# Patient Record
Sex: Male | Born: 1935 | Race: Black or African American | Hispanic: No | Smoking: Former smoker
Health system: Southern US, Community
[De-identification: ages and names within clinical notes are randomized; demographics above are authoritative.]

## PROBLEM LIST (undated history)

## (undated) DIAGNOSIS — Z923 Personal history of irradiation: Secondary | ICD-10-CM

## (undated) DIAGNOSIS — R519 Headache, unspecified: Secondary | ICD-10-CM

## (undated) DIAGNOSIS — R51 Headache: Secondary | ICD-10-CM

## (undated) DIAGNOSIS — I5189 Other ill-defined heart diseases: Secondary | ICD-10-CM

## (undated) DIAGNOSIS — J449 Chronic obstructive pulmonary disease, unspecified: Secondary | ICD-10-CM

## (undated) DIAGNOSIS — I359 Nonrheumatic aortic valve disorder, unspecified: Secondary | ICD-10-CM

## (undated) DIAGNOSIS — C61 Malignant neoplasm of prostate: Secondary | ICD-10-CM

## (undated) DIAGNOSIS — R0602 Shortness of breath: Principal | ICD-10-CM

## (undated) DIAGNOSIS — C20 Malignant neoplasm of rectum: Secondary | ICD-10-CM

## (undated) DIAGNOSIS — I071 Rheumatic tricuspid insufficiency: Secondary | ICD-10-CM

## (undated) DIAGNOSIS — R0789 Other chest pain: Secondary | ICD-10-CM

## (undated) DIAGNOSIS — R9430 Abnormal result of cardiovascular function study, unspecified: Secondary | ICD-10-CM

## (undated) DIAGNOSIS — R001 Bradycardia, unspecified: Secondary | ICD-10-CM

## (undated) DIAGNOSIS — I272 Pulmonary hypertension, unspecified: Secondary | ICD-10-CM

## (undated) DIAGNOSIS — C801 Malignant (primary) neoplasm, unspecified: Secondary | ICD-10-CM

## (undated) DIAGNOSIS — Z950 Presence of cardiac pacemaker: Secondary | ICD-10-CM

## (undated) DIAGNOSIS — I10 Essential (primary) hypertension: Secondary | ICD-10-CM

## (undated) HISTORY — DX: Nonrheumatic aortic valve disorder, unspecified: I35.9

## (undated) HISTORY — DX: Abnormal result of cardiovascular function study, unspecified: R94.30

## (undated) HISTORY — DX: Rheumatic tricuspid insufficiency: I07.1

## (undated) HISTORY — DX: Essential (primary) hypertension: I10

## (undated) HISTORY — DX: Presence of cardiac pacemaker: Z95.0

## (undated) HISTORY — DX: Pulmonary hypertension, unspecified: I27.20

## (undated) HISTORY — DX: Malignant neoplasm of prostate: C61

## (undated) HISTORY — DX: Bradycardia, unspecified: R00.1

## (undated) HISTORY — DX: Other chest pain: R07.89

## (undated) HISTORY — DX: Other ill-defined heart diseases: I51.89

## (undated) HISTORY — DX: Chronic obstructive pulmonary disease, unspecified: J44.9

## (undated) HISTORY — DX: Shortness of breath: R06.02

---
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
## 2007-03-12 ENCOUNTER — Encounter: Admission: RE | Admit: 2007-03-12 | Discharge: 2007-03-12 | Payer: Self-pay | Admitting: Family Medicine

## 2007-03-27 ENCOUNTER — Ambulatory Visit: Payer: Self-pay | Admitting: Internal Medicine

## 2007-05-05 ENCOUNTER — Encounter (HOSPITAL_COMMUNITY): Admission: RE | Admit: 2007-05-05 | Discharge: 2007-06-25 | Payer: Self-pay | Admitting: Urology

## 2007-06-17 ENCOUNTER — Ambulatory Visit: Payer: Self-pay | Admitting: Internal Medicine

## 2007-07-23 DIAGNOSIS — C61 Malignant neoplasm of prostate: Secondary | ICD-10-CM

## 2007-07-23 HISTORY — PX: EXCISIONAL HEMORRHOIDECTOMY: SHX1541

## 2007-07-23 HISTORY — DX: Malignant neoplasm of prostate: C61

## 2007-09-20 HISTORY — PX: INSERT / REPLACE / REMOVE PACEMAKER: SUR710

## 2007-10-18 ENCOUNTER — Inpatient Hospital Stay (HOSPITAL_COMMUNITY): Admission: EM | Admit: 2007-10-18 | Discharge: 2007-10-20 | Payer: Self-pay | Admitting: Emergency Medicine

## 2007-10-18 ENCOUNTER — Ambulatory Visit: Payer: Self-pay | Admitting: *Deleted

## 2007-10-20 ENCOUNTER — Encounter: Payer: Self-pay | Admitting: Cardiology

## 2007-10-27 ENCOUNTER — Ambulatory Visit: Payer: Self-pay

## 2007-11-04 ENCOUNTER — Ambulatory Visit: Payer: Self-pay

## 2007-11-04 ENCOUNTER — Ambulatory Visit: Payer: Self-pay | Admitting: Cardiology

## 2007-11-04 LAB — CONVERTED CEMR LAB
Calcium: 9.8 mg/dL (ref 8.4–10.5)
GFR calc Af Amer: 84 mL/min
Sodium: 140 meq/L (ref 135–145)

## 2007-11-05 ENCOUNTER — Ambulatory Visit: Payer: Self-pay | Admitting: Cardiology

## 2007-11-05 LAB — CONVERTED CEMR LAB: Potassium: 4.8 meq/L (ref 3.5–5.1)

## 2007-11-18 ENCOUNTER — Ambulatory Visit: Payer: Self-pay | Admitting: Cardiology

## 2008-02-09 ENCOUNTER — Ambulatory Visit: Payer: Self-pay | Admitting: Internal Medicine

## 2008-04-21 DIAGNOSIS — C20 Malignant neoplasm of rectum: Secondary | ICD-10-CM

## 2008-04-21 HISTORY — PX: BIOPSY RECTAL: PRO29

## 2008-04-21 HISTORY — PX: PROSTATE BIOPSY: SHX241

## 2008-04-21 HISTORY — DX: Malignant neoplasm of rectum: C20

## 2008-04-25 ENCOUNTER — Encounter (INDEPENDENT_AMBULATORY_CARE_PROVIDER_SITE_OTHER): Payer: Self-pay | Admitting: Surgery

## 2008-04-25 ENCOUNTER — Ambulatory Visit (HOSPITAL_COMMUNITY): Admission: RE | Admit: 2008-04-25 | Discharge: 2008-04-25 | Payer: Self-pay | Admitting: Surgery

## 2008-05-04 ENCOUNTER — Ambulatory Visit: Payer: Self-pay | Admitting: Hematology and Oncology

## 2008-05-12 ENCOUNTER — Ambulatory Visit: Payer: Self-pay | Admitting: Cardiology

## 2008-05-13 LAB — COMPREHENSIVE METABOLIC PANEL
ALT: 8 U/L (ref 0–53)
CO2: 28 mEq/L (ref 19–32)
Calcium: 9.5 mg/dL (ref 8.4–10.5)
Chloride: 102 mEq/L (ref 96–112)
Glucose, Bld: 80 mg/dL (ref 70–99)
Sodium: 140 mEq/L (ref 135–145)
Total Protein: 7.8 g/dL (ref 6.0–8.3)

## 2008-05-13 LAB — CBC WITH DIFFERENTIAL/PLATELET
BASO%: 0.7 % (ref 0.0–2.0)
Eosinophils Absolute: 0.1 10*3/uL (ref 0.0–0.5)
HCT: 35.9 % — ABNORMAL LOW (ref 38.7–49.9)
MCHC: 33 g/dL (ref 32.0–35.9)
MONO#: 0.5 10*3/uL (ref 0.1–0.9)
NEUT#: 5 10*3/uL (ref 1.5–6.5)
NEUT%: 66.1 % (ref 40.0–75.0)
RBC: 4.43 10*6/uL (ref 4.20–5.71)
WBC: 7.5 10*3/uL (ref 4.0–10.0)
lymph#: 2 10*3/uL (ref 0.9–3.3)

## 2008-05-17 ENCOUNTER — Ambulatory Visit: Admission: RE | Admit: 2008-05-17 | Discharge: 2008-08-08 | Payer: Self-pay | Admitting: Radiation Oncology

## 2008-05-20 ENCOUNTER — Ambulatory Visit (HOSPITAL_COMMUNITY): Admission: RE | Admit: 2008-05-20 | Discharge: 2008-05-20 | Payer: Self-pay | Admitting: Radiation Oncology

## 2008-06-06 ENCOUNTER — Ambulatory Visit (HOSPITAL_COMMUNITY): Admission: RE | Admit: 2008-06-06 | Discharge: 2008-06-06 | Payer: Self-pay | Admitting: Hematology and Oncology

## 2008-06-07 LAB — CBC WITH DIFFERENTIAL/PLATELET
Basophils Absolute: 0 10*3/uL (ref 0.0–0.1)
EOS%: 0 % (ref 0.0–7.0)
Eosinophils Absolute: 0 10*3/uL (ref 0.0–0.5)
LYMPH%: 17.6 % (ref 14.0–48.0)
MCH: 26.8 pg — ABNORMAL LOW (ref 28.0–33.4)
MCV: 80.9 fL — ABNORMAL LOW (ref 81.6–98.0)
MONO%: 7.7 % (ref 0.0–13.0)
Platelets: 166 10*3/uL (ref 145–400)
RBC: 4.46 10*6/uL (ref 4.20–5.71)
RDW: 14.5 % (ref 11.2–14.6)

## 2008-06-07 LAB — COMPREHENSIVE METABOLIC PANEL
ALT: <8 U/L (ref 0–53)
BUN: 48 mg/dL — ABNORMAL HIGH (ref 6–23)
CO2: 26 mEq/L (ref 19–32)
Calcium: 10 mg/dL (ref 8.4–10.5)
Chloride: 103 mEq/L (ref 96–112)
Creatinine, Ser: 1.7 mg/dL — ABNORMAL HIGH (ref 0.40–1.50)
Glucose, Bld: 115 mg/dL — ABNORMAL HIGH (ref 70–99)
Total Bilirubin: 0.4 mg/dL (ref 0.3–1.2)

## 2008-06-15 ENCOUNTER — Emergency Department (HOSPITAL_COMMUNITY): Admission: EM | Admit: 2008-06-15 | Discharge: 2008-06-15 | Payer: Self-pay | Admitting: Emergency Medicine

## 2008-06-17 ENCOUNTER — Ambulatory Visit: Payer: Self-pay | Admitting: Hematology and Oncology

## 2008-06-21 LAB — CBC WITH DIFFERENTIAL/PLATELET
Basophils Absolute: 0 10*3/uL (ref 0.0–0.1)
EOS%: 3.1 % (ref 0.0–7.0)
HCT: 35.5 % — ABNORMAL LOW (ref 38.7–49.9)
HGB: 11.9 g/dL — ABNORMAL LOW (ref 13.0–17.1)
MCH: 27.2 pg — ABNORMAL LOW (ref 28.0–33.4)
MCV: 81.4 fL — ABNORMAL LOW (ref 81.6–98.0)
MONO%: 8.9 % (ref 0.0–13.0)
NEUT%: 67.7 % (ref 40.0–75.0)
RDW: 16.1 % — ABNORMAL HIGH (ref 11.2–14.6)

## 2008-06-21 LAB — COMPREHENSIVE METABOLIC PANEL
AST: 16 U/L (ref 0–37)
Alkaline Phosphatase: 53 U/L (ref 39–117)
BUN: 23 mg/dL (ref 6–23)
Creatinine, Ser: 1.12 mg/dL (ref 0.40–1.50)
Total Bilirubin: 0.3 mg/dL (ref 0.3–1.2)

## 2008-06-28 LAB — CBC WITH DIFFERENTIAL/PLATELET
Basophils Absolute: 0 10*3/uL (ref 0.0–0.1)
EOS%: 2.7 % (ref 0.0–7.0)
HCT: 37.5 % — ABNORMAL LOW (ref 38.7–49.9)
HGB: 12.7 g/dL — ABNORMAL LOW (ref 13.0–17.1)
MCH: 27.5 pg — ABNORMAL LOW (ref 28.0–33.4)
MCV: 81.5 fL — ABNORMAL LOW (ref 81.6–98.0)
MONO%: 17.4 % — ABNORMAL HIGH (ref 0.0–13.0)
NEUT%: 62.8 % (ref 40.0–75.0)
lymph#: 0.7 10*3/uL — ABNORMAL LOW (ref 0.9–3.3)

## 2008-07-12 LAB — COMPREHENSIVE METABOLIC PANEL
AST: 19 U/L (ref 0–37)
Albumin: 4.3 g/dL (ref 3.5–5.2)
Alkaline Phosphatase: 49 U/L (ref 39–117)
BUN: 24 mg/dL — ABNORMAL HIGH (ref 6–23)
Creatinine, Ser: 1.33 mg/dL (ref 0.40–1.50)
Potassium: 5 mEq/L (ref 3.5–5.3)
Total Bilirubin: 0.5 mg/dL (ref 0.3–1.2)

## 2008-07-12 LAB — CBC WITH DIFFERENTIAL/PLATELET
Basophils Absolute: 0 10*3/uL (ref 0.0–0.1)
EOS%: 2.7 % (ref 0.0–7.0)
HGB: 12.7 g/dL — ABNORMAL LOW (ref 13.0–17.1)
LYMPH%: 7.9 % — ABNORMAL LOW (ref 14.0–48.0)
MCH: 27.3 pg — ABNORMAL LOW (ref 28.0–33.4)
MCV: 83.2 fL (ref 81.6–98.0)
MONO%: 16.5 % — ABNORMAL HIGH (ref 0.0–13.0)
Platelets: 152 10*3/uL (ref 145–400)
RDW: 17.1 % — ABNORMAL HIGH (ref 11.2–14.6)

## 2008-07-19 LAB — CBC WITH DIFFERENTIAL/PLATELET
Basophils Absolute: 0.1 10*3/uL (ref 0.0–0.1)
EOS%: 2.2 % (ref 0.0–7.0)
Eosinophils Absolute: 0.1 10*3/uL (ref 0.0–0.5)
HCT: 36.6 % — ABNORMAL LOW (ref 38.7–49.9)
HGB: 11.8 g/dL — ABNORMAL LOW (ref 13.0–17.1)
LYMPH%: 6.5 % — ABNORMAL LOW (ref 14.0–48.0)
MCH: 27.1 pg — ABNORMAL LOW (ref 28.0–33.4)
MCV: 83.9 fL (ref 81.6–98.0)
MONO%: 18.3 % — ABNORMAL HIGH (ref 0.0–13.0)
NEUT#: 3.7 10*3/uL (ref 1.5–6.5)
NEUT%: 71.2 % (ref 40.0–75.0)
Platelets: 145 10*3/uL (ref 145–400)

## 2008-08-02 ENCOUNTER — Ambulatory Visit: Payer: Self-pay | Admitting: Hematology and Oncology

## 2008-08-02 LAB — CBC WITH DIFFERENTIAL/PLATELET
BASO%: 0.1 % (ref 0.0–2.0)
Basophils Absolute: 0 10*3/uL (ref 0.0–0.1)
Eosinophils Absolute: 0 10*3/uL (ref 0.0–0.5)
HCT: 32.3 % — ABNORMAL LOW (ref 38.7–49.9)
HGB: 11 g/dL — ABNORMAL LOW (ref 13.0–17.1)
MCHC: 34.2 g/dL (ref 32.0–35.9)
MONO#: 0.7 10*3/uL (ref 0.1–0.9)
NEUT#: 2.7 10*3/uL (ref 1.5–6.5)
NEUT%: 71.2 % (ref 40.0–75.0)
WBC: 3.8 10*3/uL — ABNORMAL LOW (ref 4.0–10.0)
lymph#: 0.3 10*3/uL — ABNORMAL LOW (ref 0.9–3.3)

## 2008-08-09 LAB — CBC WITH DIFFERENTIAL/PLATELET
Basophils Absolute: 0 10*3/uL (ref 0.0–0.1)
EOS%: 0.8 % (ref 0.0–7.0)
HCT: 35.7 % — ABNORMAL LOW (ref 38.7–49.9)
HGB: 11.8 g/dL — ABNORMAL LOW (ref 13.0–17.1)
MCH: 27.6 pg — ABNORMAL LOW (ref 28.0–33.4)
NEUT%: 66.3 % (ref 40.0–75.0)
Platelets: 132 10*3/uL — ABNORMAL LOW (ref 145–400)
lymph#: 0.5 10*3/uL — ABNORMAL LOW (ref 0.9–3.3)

## 2008-09-06 ENCOUNTER — Ambulatory Visit (HOSPITAL_COMMUNITY): Admission: RE | Admit: 2008-09-06 | Discharge: 2008-09-06 | Payer: Self-pay | Admitting: Hematology and Oncology

## 2008-09-15 LAB — COMPREHENSIVE METABOLIC PANEL
BUN: 21 mg/dL (ref 6–23)
CO2: 27 mEq/L (ref 19–32)
Calcium: 10 mg/dL (ref 8.4–10.5)
Chloride: 104 mEq/L (ref 96–112)
Creatinine, Ser: 0.97 mg/dL (ref 0.40–1.50)
Glucose, Bld: 90 mg/dL (ref 70–99)
Total Bilirubin: 0.4 mg/dL (ref 0.3–1.2)

## 2008-09-15 LAB — CBC WITH DIFFERENTIAL/PLATELET
Basophils Absolute: 0 10*3/uL (ref 0.0–0.1)
HCT: 40.9 % (ref 38.4–49.9)
HGB: 13.7 g/dL (ref 13.0–17.1)
LYMPH%: 25.6 % (ref 14.0–49.0)
MCH: 28.2 pg (ref 27.2–33.4)
MONO#: 0.3 10*3/uL (ref 0.1–0.9)
NEUT%: 63.7 % (ref 39.0–75.0)
Platelets: 161 10*3/uL (ref 140–400)
WBC: 3 10*3/uL — ABNORMAL LOW (ref 4.0–10.3)
lymph#: 0.8 10*3/uL — ABNORMAL LOW (ref 0.9–3.3)

## 2008-09-20 ENCOUNTER — Encounter: Admission: RE | Admit: 2008-09-20 | Discharge: 2008-09-20 | Payer: Self-pay | Admitting: Surgery

## 2008-11-04 ENCOUNTER — Encounter (INDEPENDENT_AMBULATORY_CARE_PROVIDER_SITE_OTHER): Payer: Self-pay | Admitting: *Deleted

## 2008-11-22 ENCOUNTER — Telehealth: Payer: Self-pay | Admitting: Internal Medicine

## 2008-11-23 ENCOUNTER — Encounter: Payer: Self-pay | Admitting: Internal Medicine

## 2008-11-23 ENCOUNTER — Ambulatory Visit: Payer: Self-pay

## 2008-11-26 DIAGNOSIS — I2789 Other specified pulmonary heart diseases: Secondary | ICD-10-CM

## 2008-11-26 DIAGNOSIS — I079 Rheumatic tricuspid valve disease, unspecified: Secondary | ICD-10-CM | POA: Insufficient documentation

## 2008-11-26 DIAGNOSIS — J449 Chronic obstructive pulmonary disease, unspecified: Secondary | ICD-10-CM | POA: Insufficient documentation

## 2008-11-26 DIAGNOSIS — J4489 Other specified chronic obstructive pulmonary disease: Secondary | ICD-10-CM | POA: Insufficient documentation

## 2008-11-26 DIAGNOSIS — I359 Nonrheumatic aortic valve disorder, unspecified: Secondary | ICD-10-CM | POA: Insufficient documentation

## 2008-11-28 ENCOUNTER — Ambulatory Visit: Payer: Self-pay | Admitting: Cardiology

## 2008-12-09 ENCOUNTER — Ambulatory Visit: Payer: Self-pay | Admitting: Hematology and Oncology

## 2008-12-13 ENCOUNTER — Encounter: Payer: Self-pay | Admitting: Cardiology

## 2008-12-13 LAB — CBC WITH DIFFERENTIAL/PLATELET
BASO%: 0.4 % (ref 0.0–2.0)
Basophils Absolute: 0 10*3/uL (ref 0.0–0.1)
EOS%: 1.6 % (ref 0.0–7.0)
HCT: 37.3 % — ABNORMAL LOW (ref 38.4–49.9)
HGB: 12.3 g/dL — ABNORMAL LOW (ref 13.0–17.1)
LYMPH%: 21.2 % (ref 14.0–49.0)
MCH: 28.4 pg (ref 27.2–33.4)
MCHC: 32.9 g/dL (ref 32.0–36.0)
MCV: 86.1 fL (ref 79.3–98.0)
NEUT%: 63.8 % (ref 39.0–75.0)
Platelets: 104 10*3/uL — ABNORMAL LOW (ref 140–400)
lymph#: 0.8 10*3/uL — ABNORMAL LOW (ref 0.9–3.3)

## 2008-12-13 LAB — COMPREHENSIVE METABOLIC PANEL
AST: 17 U/L (ref 0–37)
BUN: 17 mg/dL (ref 6–23)
Calcium: 9.8 mg/dL (ref 8.4–10.5)
Chloride: 105 mEq/L (ref 96–112)
Creatinine, Ser: 1.14 mg/dL (ref 0.40–1.50)
Total Bilirubin: 0.4 mg/dL (ref 0.3–1.2)

## 2009-02-20 ENCOUNTER — Ambulatory Visit (HOSPITAL_COMMUNITY): Admission: RE | Admit: 2009-02-20 | Discharge: 2009-02-20 | Payer: Self-pay | Admitting: Hematology and Oncology

## 2009-03-14 ENCOUNTER — Ambulatory Visit: Payer: Self-pay | Admitting: Hematology and Oncology

## 2009-03-17 LAB — CBC WITH DIFFERENTIAL/PLATELET
BASO%: 0.6 % (ref 0.0–2.0)
Basophils Absolute: 0 10*3/uL (ref 0.0–0.1)
EOS%: 1.5 % (ref 0.0–7.0)
HCT: 37.2 % — ABNORMAL LOW (ref 38.4–49.9)
HGB: 12.4 g/dL — ABNORMAL LOW (ref 13.0–17.1)
LYMPH%: 19.3 % (ref 14.0–49.0)
MCH: 27.5 pg (ref 27.2–33.4)
MCHC: 33.3 g/dL (ref 32.0–36.0)
MCV: 82.7 fL (ref 79.3–98.0)
MONO%: 7.3 % (ref 0.0–14.0)
NEUT%: 71.3 % (ref 39.0–75.0)
lymph#: 0.6 10*3/uL — ABNORMAL LOW (ref 0.9–3.3)

## 2009-03-17 LAB — COMPREHENSIVE METABOLIC PANEL
ALT: <8 U/L (ref 0–53)
AST: 14 U/L (ref 0–37)
Alkaline Phosphatase: 50 U/L (ref 39–117)
BUN: 14 mg/dL (ref 6–23)
Calcium: 9.4 mg/dL (ref 8.4–10.5)
Chloride: 104 mEq/L (ref 96–112)
Creatinine, Ser: 1.02 mg/dL (ref 0.40–1.50)
Total Bilirubin: 0.4 mg/dL (ref 0.3–1.2)

## 2009-03-22 ENCOUNTER — Encounter: Payer: Self-pay | Admitting: Cardiology

## 2009-05-29 ENCOUNTER — Encounter: Payer: Self-pay | Admitting: Cardiology

## 2009-05-30 ENCOUNTER — Ambulatory Visit: Payer: Self-pay | Admitting: Cardiology

## 2009-08-04 ENCOUNTER — Ambulatory Visit: Payer: Self-pay | Admitting: Hematology and Oncology

## 2009-08-14 ENCOUNTER — Ambulatory Visit (HOSPITAL_COMMUNITY): Admission: RE | Admit: 2009-08-14 | Discharge: 2009-08-14 | Payer: Self-pay | Admitting: Oncology

## 2009-08-14 LAB — CBC WITH DIFFERENTIAL/PLATELET
BASO%: 0.5 % (ref 0.0–2.0)
LYMPH%: 29.6 % (ref 14.0–49.0)
MCHC: 31.9 g/dL — ABNORMAL LOW (ref 32.0–36.0)
MONO#: 0.3 10*3/uL (ref 0.1–0.9)
Platelets: 140 10*3/uL (ref 140–400)
RBC: 4.96 10*6/uL (ref 4.20–5.82)
WBC: 3.8 10*3/uL — ABNORMAL LOW (ref 4.0–10.3)
lymph#: 1.1 10*3/uL (ref 0.9–3.3)
nRBC: 0 % (ref 0–0)

## 2009-08-14 LAB — COMPREHENSIVE METABOLIC PANEL
ALT: 10 U/L (ref 0–53)
AST: 22 U/L (ref 0–37)
Alkaline Phosphatase: 57 U/L (ref 39–117)
Calcium: 9.2 mg/dL (ref 8.4–10.5)
Chloride: 101 mEq/L (ref 96–112)
Creatinine, Ser: 1 mg/dL (ref 0.40–1.50)
Total Bilirubin: 0.4 mg/dL (ref 0.3–1.2)

## 2009-08-14 LAB — CEA: CEA: 1.4 ng/mL (ref 0.0–5.0)

## 2009-08-17 ENCOUNTER — Encounter: Payer: Self-pay | Admitting: Cardiology

## 2010-02-08 ENCOUNTER — Ambulatory Visit: Payer: Self-pay | Admitting: Hematology and Oncology

## 2010-02-12 ENCOUNTER — Ambulatory Visit (HOSPITAL_COMMUNITY): Admission: RE | Admit: 2010-02-12 | Discharge: 2010-02-12 | Payer: Self-pay | Admitting: Hematology and Oncology

## 2010-02-12 LAB — COMPREHENSIVE METABOLIC PANEL
AST: 18 U/L (ref 0–37)
Albumin: 3.8 g/dL (ref 3.5–5.2)
BUN: 25 mg/dL — ABNORMAL HIGH (ref 6–23)
Calcium: 9.6 mg/dL (ref 8.4–10.5)
Chloride: 104 mEq/L (ref 96–112)
Creatinine, Ser: 1.26 mg/dL (ref 0.40–1.50)
Glucose, Bld: 100 mg/dL — ABNORMAL HIGH (ref 70–99)
Potassium: 4.8 mEq/L (ref 3.5–5.3)

## 2010-02-12 LAB — CBC WITH DIFFERENTIAL/PLATELET
Basophils Absolute: 0 10*3/uL (ref 0.0–0.1)
EOS%: 1.4 % (ref 0.0–7.0)
Eosinophils Absolute: 0.1 10*3/uL (ref 0.0–0.5)
HCT: 37.3 % — ABNORMAL LOW (ref 38.4–49.9)
HGB: 12.6 g/dL — ABNORMAL LOW (ref 13.0–17.1)
MCH: 28 pg (ref 27.2–33.4)
MCV: 82.8 fL (ref 79.3–98.0)
NEUT#: 2.3 10*3/uL (ref 1.5–6.5)
NEUT%: 58.3 % (ref 39.0–75.0)
RDW: 15.4 % — ABNORMAL HIGH (ref 11.0–14.6)
lymph#: 1.1 10*3/uL (ref 0.9–3.3)

## 2010-02-12 LAB — LACTATE DEHYDROGENASE: LDH: 183 U/L (ref 94–250)

## 2010-02-19 ENCOUNTER — Encounter (INDEPENDENT_AMBULATORY_CARE_PROVIDER_SITE_OTHER): Payer: Self-pay | Admitting: *Deleted

## 2010-05-25 ENCOUNTER — Encounter: Payer: Self-pay | Admitting: Cardiology

## 2010-05-28 ENCOUNTER — Encounter: Payer: Self-pay | Admitting: Internal Medicine

## 2010-05-28 ENCOUNTER — Ambulatory Visit: Payer: Self-pay | Admitting: Cardiology

## 2010-08-12 ENCOUNTER — Encounter: Payer: Self-pay | Admitting: Hematology and Oncology

## 2010-08-12 ENCOUNTER — Encounter (HOSPITAL_COMMUNITY): Payer: Self-pay | Admitting: Oncology

## 2010-08-13 ENCOUNTER — Encounter: Payer: Self-pay | Admitting: Hematology and Oncology

## 2010-08-13 ENCOUNTER — Encounter: Payer: Self-pay | Admitting: Radiation Oncology

## 2010-08-23 NOTE — Cardiovascular Report (Signed)
Summary: Office Visit   Office Visit   Imported By: Roderic Ovens 06/05/2010 17:02:53  _____________________________________________________________________  External Attachment:    Type:   Image     Comment:   External Document

## 2010-08-23 NOTE — Letter (Signed)
Summary: Device-Delinquent Check  [facility], Main Office  [address]   [place], [place] [address]   Phone: [phone]  Fax: [phone]     February 19, 2010 MRN: [id number]   [facility] [address] [place], [place]  [address]   Dear Mr. Dass,  According to our records, you have not had your implanted device checked with Dr. Ladona Ridgel in the recommended period of time.  We are unable to determine appropriate device function without checking your device on a regular basis.  Please call our office to schedule an appointment as soon as possible.  If you are having your device checked by another physician, please call us so that we may update our records.  Thank you,   Arch[facility]

## 2010-08-23 NOTE — Procedures (Signed)
Summary: Cardiology Device Clinic   Current Medications (verified): 1)  Caltrate 600+d 600-400 Mg-Unit Tabs (Calcium Carbonate-Vitamin D) .... Take One Tablet By Mouth Twice Daily. 2)  Lisinopril 20 Mg Tabs (Lisinopril) .... Take One Tablet By Mouth Daily  Allergies (verified): No Known Drug Allergies  PPM Specifications PPM Vendor:  Medtronic     PPM Model Number:  [id number]     PPM DOI:  10/19/2007      Lead 1    Location: RA     DOI: 10/19/2007     Model #: [id number]     Serial #: [id number]     Status: active Lead 2    Location: RV     DOI: 10/19/2007     Model #: [id number]     Serial #: [id number]     Status: active   Indications:  CHB   PPM Follow Up Battery Voltage:  2.79 V     Battery Est. Longevity:  9 yrs     Pacer Dependent:  Yes       PPM Device Measurements Atrium  Amplitude: 1.00 mV, Impedance: 437 ohms, Threshold: 0.750 V at 0.40 msec Right Ventricle  Amplitude: PACED mV, Impedance: 503 ohms, Threshold: 0.750 V at 0.40 msec  Episodes MS Episodes:  242     Percent Mode Switch:  <0.1%     Coumadin:  No Ventricular High Rate:  1     Atrial Pacing:  9.2%     Ventricular Pacing:  99.3%  Parameters Mode:  DDDR     Lower Rate Limit:  60     Upper Rate Limit:  130 Paced AV Delay:  150     Sensed AV Delay:  120 Next Cardiology Appt Due:  11/20/2010 Tech Comments:  242 MODE SWITCHES--LONGEST WAS 4 MIN 39 SECONDS.  NORMAL DEVICE FUNCTION.  CHANGED RA OUTPUT FROM 1.75 TO 2.00 AND RV OUTPUT FROM 2.00 TO 2.50 V.  ROV IN 6 MTHS W/GT. [name]  May 28, 2010 3:44 PM

## 2010-08-23 NOTE — Assessment & Plan Note (Signed)
Summary: f1y   Visit Type:  Follow-up Primary Jenee Spaugh:  None  CC:  bradycardia.  History of Present Illness: The patient is seen for followup of bradycardia and pulmonary hypertension.  His pacemaker was placed in the past.  He has significant pulmonary hypertension.  He has good left ventricular function.  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  This is being treated.  He is not having chest pain syncope or presyncope.  Current Medications (verified): 1)  Caltrate 600+d 600-400 Mg-Unit Tabs (Calcium Carbonate-Vitamin D) .... Take One Tablet By Mouth Twice Daily. 2)  Lisinopril 20 Mg Tabs (Lisinopril) .... 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 March, 2009 COPD Prostate cancer Aortic valve calcification with mild AI...  Echo,... 2009 Status post appendectomy Nuclear study... EF 57% no definite ischemia.Marland KitchenMarland Kitchen April, 2009.. LV function..normal... echo... march, 2009 Squamous cell (invasive)  of the anal canal   Dr. Dalene Carrow  Review of Systems       The patient denies fever, chills, sweats, rash, change in vision, change in hearing, chest pain, cough, nausea vomiting, urinary symptoms.  All other systems are reviewed and are negative.  Vital Signs:  Patient profile:   77 year old male Height:      72 inches Weight:      137 pounds BMI:     18.65 Pulse rate:   86 / minute BP sitting:   136 / 68  (left arm) Cuff size:   regular  Vitals Entered By: Hardin Negus, RMA (May 28, 2010 12:02 PM)  Physical Exam  General:  patient is thin but stable. Eyes:  no xanthelasma. Mouth:   patient has poor dentition. Neck:  no jugular venous distention. Lungs:  lungs reveal distant lung sounds.  There is no respiratory distress. Heart:  cardiac exam reveals an S1-S2.  No clicks or significant murmurs. Abdomen:  abdomen is soft. Extremities:  no peripheral edema. Psych:  patient is oriented to person time and place.  Affect is normal.   PPM Specifications PPM Vendor:  Medtronic     PPM Model Number:  [id number]     PPM DOI:  10/19/2007      Lead 1    Location: RA     DOI: 10/19/2007     Model #: [id number]     Serial #: [id number]     Status: active Lead 2    Location: RV     DOI: 10/19/2007     Model #: 8469     Serial #: [id number]     Status: active   Indications:  CHB   PPM Follow Up Pacer Dependent:  Yes      Episodes Coumadin:  No  Parameters Mode:  DDDR     Lower Rate Limit:  60     Upper Rate Limit:  130 Paced AV Delay:  150     Sensed AV Delay:  120  Impression & Recommendations:  Problem # 1:  AORTIC INSUFFICIENCY (ICD-424.1)  His updated medication list for this problem includes:    Lisinopril 20 Mg Tabs (Lisinopril) .Marland Kitchen... Take one tablet by mouth daily The patient has mild aortic insufficiency by history.  He does not  need a followup echo at this time.  Problem # 2:  ESSENTIAL HYPERTENSION, BENIGN (ICD-401.1)  His updated medication list for this problem includes:    Lisinopril 20 Mg Tabs (Lisinopril) .Marland Kitchen... Take one tablet by mouth daily Blood pressure is controlled today.  No change in therapy.  Problem # 3:  COPD (ICD-496) COPD is stable at this time.  No change in therapy.  Problem # 4:  PACEMAKER, PERMANENT (ICD-V45.01)  Orders: EKG w/ Interpretation (93000) The patient had an EKG today and he is paced.  Full interrogation of his pacemaker is being done. Also, patient does not have a primary physician. But he does have an oncologist.  I am sure that they are following his labs carefully.  Patient's pacemaker is interrogated today.  All  parameters are stable.  No further workup.  Patient Instructions: 1)  Your physician wants you to follow-up in:  1 year with Dr Myrtis Ser.  You will receive a reminder letter in the mail two months in advance. If you don't receive a letter, please call our office to schedule the follow-up appointment. 2)  Your physician wants you to follow-up in:  6 months with Dr Ladona Ridgel.  You will receive a reminder letter in the mail two months in advance. If you don't receive a letter, please call our office to schedule the follow-up appointment.

## 2010-08-23 NOTE — Letter (Signed)
[facility]   Imported By: Roderic Ovens 09/15/2009 10:38:58  _____________________________________________________________________  External Attachment:    Type:   Image     Comment:   External Document

## 2010-08-23 NOTE — Miscellaneous (Signed)
  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 March, 2009 COPD Prostate cancer Aortic valve calcification with mild AI...  Echo,... 2009 Status post appendectomy Nuclear study... EF 57% no definite ischemia.Marland KitchenMarland Kitchen April, 2009 LV function..normal... echo... march, 2009 Squamous cell (invasive)  of the anal canal   Dr. 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 March, 2009 COPD Prostate cancer Aortic valve calcification with mild AI...  Echo,... 2009 Status post appendectomy Nuclear study... EF 57% no definite ischemia.Marland KitchenMarland Kitchen April, 2009 LV function..normal... echo... march, 2009 Squamous cell (invasive)  of the anal canal   Dr. Dalene Carrow

## 2010-11-06 LAB — GLUCOSE, CAPILLARY: Glucose-Capillary: 81 mg/dL (ref 70–99)

## 2010-11-21 ENCOUNTER — Encounter: Payer: Self-pay | Admitting: Internal Medicine

## 2010-11-22 ENCOUNTER — Ambulatory Visit (INDEPENDENT_AMBULATORY_CARE_PROVIDER_SITE_OTHER): Payer: Medicare Other | Admitting: Internal Medicine

## 2010-11-22 ENCOUNTER — Encounter: Payer: Self-pay | Admitting: Internal Medicine

## 2010-11-22 VITALS — BP 132/80 | HR 84 | Ht 73.0 in | Wt 135.0 lb

## 2010-11-22 DIAGNOSIS — I442 Atrioventricular block, complete: Secondary | ICD-10-CM

## 2010-11-22 DIAGNOSIS — Z950 Presence of cardiac pacemaker: Secondary | ICD-10-CM

## 2010-11-22 DIAGNOSIS — I10 Essential (primary) hypertension: Secondary | ICD-10-CM

## 2010-11-22 MED ORDER — LISINOPRIL 20 MG PO TABS: 20.0000 mg | ORAL_TABLET | Freq: Every day | ORAL | Status: DC

## 2010-11-22 NOTE — Assessment & Plan Note (Signed)
Despite his noncompliance, his blood pressure remains fairly well controlled. I've asked him to restart his medical therapy, and maintain a low-sodium diet.

## 2010-11-22 NOTE — Patient Instructions (Signed)
Your physician wants you to follow-up in: 6 months in device clinic and 12 months with Dr Taylor You will receive a reminder letter in the mail two months in advance. If you don't receive a letter, please call our office to schedule the follow-up appointment.  

## 2010-11-22 NOTE — Progress Notes (Signed)
HPI [name] returns today for followup. He is a pleasant elderly man with a history of symptomatic bradycardia and hypertension. He is status post permanent pacemaker insertion. He admits to dietary as well as medical noncompliance. They said he has been out of his lisinopril for over a month.he denies syncope, chest pain, or shortness of breath. No peripheral edema. Not on File   Current Outpatient Prescriptions  Medication Sig Dispense Refill  . Calcium Carbonate-Vitamin D (CALTRATE 600+D) 600-400 MG-UNIT per tablet Take 1 tablet by mouth daily.        Marland Kitchen lisinopril (PRINIVIL,ZESTRIL) 20 MG tablet Take 1 tablet (20 mg total) by mouth daily.  30 tablet  11  . DISCONTD: calcium-vitamin D (OSCAL WITH D) 250-125 MG-UNIT per tablet Take 1 tablet by mouth daily.        Marland Kitchen DISCONTD: lisinopril (PRINIVIL,ZESTRIL) 20 MG tablet Take 20 mg by mouth daily.           Past Medical History  Diagnosis Date  . Bradycardia   . Hypertension   . Tricuspid regurgitation     mild/moderate  . COPD (chronic obstructive pulmonary disease)   . Prostate cancer   . Aortic valve calcification   . Pulmonary HTN     ROS:   All systems reviewed and negative except as noted in the HPI.   Past Surgical History  Procedure Date  . Appendectomy      No family history on file.   History   Social History  . Marital Status: Legally Separated    Spouse Name: N/A    Number of Children: N/A  . Years of Education: N/A   Occupational History  . retired    Social History Main Topics  . Smoking status: Passive Smoker  . Smokeless tobacco: Not on file  . Alcohol Use: No  . Drug Use: No  . Sexually Active:    Other Topics Concern  . Not on file   Social History Narrative  . No narrative on file     BP 132/80  Pulse 84  Ht 6\' 1"  (1.854 m)  Wt 135 lb (61.236 kg)  BMI 17.81 kg/m2  Physical Exam: Thin, Well appearing NAD HEENT: Unremarkable Neck:  No JVD, no thyromegally Lymphatics:  No  adenopathy Back:  No CVA tenderness Lungs:  Clear. Well-healed pacemaker incision HEART:  Regular rate rhythm, no murmurs, no rubs, no clicks Abd:  Flat, positive bowel sounds, no organomegally, no rebound, no guarding Ext:  2 plus pulses, no edema, no cyanosis, no clubbing Skin:  No rashes no nodules Neuro:  CN II through XII intact, motor grossly intact  DEVICE  Normal device function.  See PaceArt for details.   Assess/Plan:

## 2010-11-22 NOTE — Assessment & Plan Note (Signed)
His device is working normally. We'll recheck in several months. 

## 2010-12-04 NOTE — Assessment & Plan Note (Signed)
[facility]                         ELECTROPHYSIOLOGY OFFICE NOTE   [name], [name]                       MRN:          [id number]  DATE:02/09/2008                            DOB:          Jan 02, 1930    [name] returns today for followup.  He is a very pleasant elderly  male with a history of complete heart block status post pacemaker  insertion back in March 2009.  He returns today for followup.  He is  scheduled undergo surgery for a rectal mass and will require biopsy for  this.  He has no specific complaints today except he has some very mild  leg discomfort.  His current medications include calcium supplement.   PHYSICAL EXAMINATION:  GENERAL:  He is a pleasant elderly man, in no  distress.  VITAL SIGNS:  Blood pressure 147/88, the pulse was 110 and irregular,  respirations were 18, and the weight was 135 pounds.  NECK:  No jugular venous distention.  LUNGS:  Clear bilaterally to auscultation.  No wheezes, rales, or  rhonchi are present.  CARDIOVASCULAR:  Irregular rhythm with normal S1 and S2.  EXTREMITIES:  Demonstrated no edema.   Interrogation of his pacemaker demonstrates a Medtronic Goodell, P-waves  were 4, the R-waves were unavailable secondary to complete heart block,  pacing threshold was 0.75 at 0.4 in the A and 0.5 at 0.4 in the RV, and  the pacing impedance was 443 in the A and 532 in the RV.  Battery  voltage was 2.79 volts.  His estimated longevity on his device was  approximately 7 years.  His histograms demonstrated that his underlying  sinus rate was somewhat increased.   IMPRESSION:  1. Symptomatic bradycardia secondary to complete heart block.  2. Status post pacemaker insertion.  3. History of hypertension.  4. Anal mass with the patient scheduled for biopsy undergo under      anesthesia.   DISCUSSION:  With regard to [name] pending surgery, I think he  will be low risk and would be allowed to proceed  with his biopsy  utilizing general anesthesia.  Obviously if he get problems during  surgery, we would be available on an as-needed basis.     Doylene Canning. Ladona Ridgel, MD  Electronically Signed    GWT/MedQ  DD: 02/09/2008  DT: 02/10/2008  Job #: [id number]   cc:   [name], M.D.

## 2010-12-04 NOTE — Discharge Summary (Signed)
[name], [name]              ACCOUNT NO.:  [id number]   MEDICAL RECORD NO.:  [id number]          PATIENT TYPE:  INP   LOCATION:  3707                         FACILITY:  [facility]   PHYSICIAN:  Jacob Canning. Ladona Ridgel, MD    DATE OF BIRTH:  Jan 27, 1930   DATE OF ADMISSION:  10/18/2007  DATE OF DISCHARGE:  10/20/2007                               DISCHARGE SUMMARY   ALLERGIES:  This patient has no known drug allergies.   DICTATION TIME AND EXAM:  Greater than 35 minutes.   FINAL DIAGNOSES:  1. Discharging day 1, status post implant of Medtronic Sensia dual-      chamber pacemaker.  2. Complete heart block on presentation, this admission with major      symptom dyspnea.  3. Hypertension with application of new medications to control this      admission.   SECONDARY DIAGNOSES:  1. Chronic obstructive pulmonary disease.  2. History of prostate cancer.  3. Troponin I study is negative this admission x3.   PROCEDURE:  October 19, 2007, implant of a Medtronic Sensia dual-chamber  pacemaker for complete heart block by Dr. Lewayne Bunting.  The patient had  no postprocedural complications.  No hematoma including no pain, that  could not be controlled with oral analgesics.  Chest x-ray shows that  the leads are in appropriate position.  The device has been interrogated  with normal function, and no changes made.  Mobility of the left arm has  been described to the patient as well as incision care.   BRIEF HISTORY:  [name].  He has a history of  COPD and prostate cancer.  He was awakened from sleep on the evening of  October 18, 2007, he was short of breath.  He had no chest pain,  presyncope, syncope, nausea, vomiting, or diaphoresis.  He has not been  experiencing edema, orthopnea, or paroxysmal nocturnal dyspnea.  He was  actually feeling well prior to going to sleep this evening on October 18, 2007.   He went outside to get some fresh air but did not help.  He came to  the  emergency room, he was found to be in third-degree heart block with  idioventricular escape rhythm.   HOSPITAL COURSE:  The patient was admitted to the emergency room to  [facility] on October 18, 2007.  He has complete heart block with  bradycardia and dyspnea.  His dyspnea is fairly well controlled.  He did  require temporary pacemaker over the weekend and early pacemaker  implantation.  Cardiac enzymes were cycled, and they were negative x3.  Electrocardiogram shows third-degree AVB with right bundle branch block.  The patient was found to be hypertensive generally, and his medications  prior to this admission consisted only of a multivitamin daily.  He was  added lisinopril 20 mg daily and then with persistent elevated blood  pressures, was added hydrochlorothiazide postprocedure day #1 after  implant of pacemaker.  He did have pacemaker implant October 19, 2007,  with no complications as mentioned above.  Discharging postprocedure day  #1,  he is asked to keep his incision dry for the next 7 days, to sponge  bathe until Monday, October 26, 2007.   MEDICATIONS AT DISCHARGE:  1. Multivitamin daily.  2. A new medication, lisinopril 20 mg daily.  Prescription given.  3. A new medication, hydrochlorothiazide 25 mg daily.  Prescription      given.  4. A new medication, potassium chloride 10 mEq daily.  Prescription      given.   FOLLOWUP:  He is to follow up at [facility] [place] for several  appointments:  1. Stress test, Tuesday, October 27, 2007, at 9:15.  He is asked to eat      nothing or drink nothing after midnight Monday, October 26, 2007.  He      may take his medication with sips of water in the morning on October 27, 2007.  2. He sees Dr. Eden Emms, Thursday, October 29, 2007, at 9 o'clock.  3. Pacer Clinic, Wednesday, November 04, 2007 at 9 o'clock.  A basic      metabolic panel will be taken at that time to check creatinine and      potassium on hydrochlorothiazide.  4. He sees Dr.  [name], Tuesday, February 09, 2008, at 11:10 in the      morning.   LABORATORY DATA:  Lab studies pertinent to this admission were drawn on  October 18, 2007.  Sodium 145, potassium is 4, chloride 111, carbonate 25,  glucose 102, BUN is 18, creatinine 1.05, alkaline phosphatase is 120,  SGOT 71, SGPT is 37.  His complete blood count; white cells are 9,  hemoglobin 11.6, hematocrit 35.5, platelets are 123.  Of note, his TSH  was 0.399 on lower side.  This could possibly be followed up as an  outpatient.      Maple Mirza, PA      Jacob Canning. Ladona Ridgel, MD  Electronically Signed    GM/MEDQ  D:  10/20/2007  T:  10/20/2007  Job:  [id number]   cc:   Noralyn Pick. Eden Emms, MD, [facility]

## 2010-12-04 NOTE — Assessment & Plan Note (Signed)
[facility]                             PULMONARY OFFICE NOTE   [name], [name]                       MRN:          [id number]  DATE:06/17/2007                            DOB:          1930-03-14    PULMONARY FOLLOW UP OFFICE VISIT:   HISTORY:  A 77 year old black male in for evaluation of COPD, stating he  has no breathing problem whatsoever with desired activities of daily  living.  He denies any cough, fevers, chills, sweats, orthopnea, PND or  leg swelling.   The purpose of his visit today is to obtain PFT's for baseline purposes.  Status post smoking cessation earlier this month.   MEDICATIONS:  The patient is taking no pulmonary medicines at present.   PHYSICAL EXAMINATION:  GENERAL:  He is a pleasant, ambulatory black male  in no acute distress.  VITAL SIGNS:  Stable.  HEENT:  Unremarkable.  Oropharynx clear.  LUNGS:  Lung fields are clear bilaterally to auscultation and  percussion, although breath sounds are diminished.  HEART:  Regular rhythm without murmur, gallop, or rub.  ABDOMEN:  Soft, benign.  EXTREMITIES:  Warm without calf tenderness.  No clubbing, cyanosis, or  edema.   PFT's performed indicate an FEV1 of 56% predicted with a ratio of 43%.   IMPRESSION:  Severe chronic obstructive pulmonary disease with a  diffusion capacity of 67%, suggesting that this is mostly emphysematous  in nature.  It may well be that if he is not smoking anymore, the  asthmatic component is mild enough that he will not need any form of  chronic suppressive therapy.  If he has frequent exacerbations, I would  start him on Advair.  If he notices a decline in best day lung function  and is unable to do what he wants, I would start him on Spiriva.  However, right now he is not inclined to take medicines.  I told him  that is fine as long as he remembers that smoking cessation is much more  important than choice of medications, or for that matter  doctors.  Therefore, pulmonary clinic will be p.r.n.     Charlaine Dalton. Sherene Sires, MD, [facility]  Electronically Signed    MBW/MedQ  DD: 06/18/2007  DT: 06/18/2007  Job #: [id number]

## 2010-12-04 NOTE — Assessment & Plan Note (Signed)
[facility]                             PULMONARY OFFICE NOTE   STARK, AGUINAGA                       MRN:          [id number]  DATE:03/27/2007                            DOB:          August 25, 1929    A delightful 77 year old black male, active smoker, complaining of  variable dyspnea for the last nine months, typically bothering him when  he goes out into the heat.  He could not give me one example, however,  of a reproducible pattern of dyspnea that occurs as long as he is in air  conditioning.  I asked him this question numerous ways to try to get at  the idea of whether he was having reproducible, predictable dyspnea with  a particular activity.  Even after asking the question multiple ways, he  was not able to respond to it.  That is, there does not appear to be one  single activity that he can list that he does daily that gives him any  difficulty at all.  Note that he does continue to smoke, although he has  cut it down to only nine cigarettes per day.  He denies any variability  with the seasons.  He denies any orthopnea, PND, leg swelling, chest  pain, or significant sputum production.   Past medical history is significant only for the fact that he has had  appendectomy.  He says he never goes to the doctor.   MEDICATIONS:  None.   SOCIAL HISTORY:  He continues to smoke, as noted above.   FAMILY HISTORY:  Positive for cancer in his sister, unknown cell type.   REVIEW OF SYSTEMS:  Taken in detail on the work sheet and negative  except as outlined above.   PHYSICAL EXAMINATION:  This is a stoic, ambulatory black male who, as  above, had a difficult time verbalizing exactly what was bothering him.  He is afebrile with normal vital signs.  HEENT:  Unremarkable except for poor dentition.  NECK:  Supple without cervical adenopathy or tenderness.  Trachea is  midline.  No thyromegaly.  LUNGS:  Mild hyperresonant percussion.  HEART:   Regular rhythm without murmur, rub or gallop.  ABDOMEN:  Soft, benign.  EXTREMITIES:  Warm without calf tenderness, clubbing, cyanosis or edema.   Chest x-ray revealed COPD with pleural thickening at the left apex.  The  study is done on the [facility] system, dated March 12, 2007, which was  reviewed.   IMPRESSION:  Dyspnea that is variable but initially suggesting an  asthmatic component but for the fact that he has no nocturnal symptoms  or for that matter, any predictable activity other than sometimes when  I get out in the heat, making him short of breath.  I suspect what this  patient has done is gradually reduced his activity level to the point  where he avoids being short of breath because he actually knows doing  anything more than slow ADLs in an air conditioning is going to make him  short of breath.  This is bad news because it also is a strategy many  patients use who have COPD with a progressive decline and best AFEV1 and  yet continue to smoke.  Without smoking cessation, there is very little  I can offer this patient to change the natural history of his disease.   I would, however, strongly recommend the patient return here for a set  of PFTs with pre and post bronchodilator studies and then might consider  initiating Spiriva, depending on the severity of his disease and whether  or not he is albuterol-responsive.   In the meantime, I pleaded with him to commit to quit smoking.     Charlaine Dalton. Sherene Sires, MD, [facility]  Electronically Signed    MBW/MedQ  DD: 03/27/2007  DT: 03/27/2007  Job #: [id number]   cc:   Burtis Junes, M.D.

## 2010-12-04 NOTE — Op Note (Signed)
NAMEONEY, FOLZ              ACCOUNT NO.:  [id number]   MEDICAL RECORD NO.:  [id number]          PATIENT TYPE:  AMB   LOCATION:  SDS                          FACILITY:  [facility]   PHYSICIAN:  [name], M.D.DATE OF BIRTH:  [birth year]   DATE OF PROCEDURE:  04/25/2008  DATE OF DISCHARGE:  04/25/2008                               OPERATIVE REPORT   PREOPERATIVE DIAGNOSIS:  Fungating mass, posterior anal canal.   POSTOPERATIVE DIAGNOSIS:  Squamous cell carcinoma, posterior anal canal.   PROCEDURE:  1. Exam under anesthesia.  2. Biopsy of posterior anal canal mass.   SURGEON:  Maisie Fus A. Cornett, MD   ANESTHESIA:  LMA with 0.25% Sensorcaine.   ESTIMATED BLOOD LOSS:  20 mL.   SPECIMENS:  Multiple tissue fragments from large posterior anal mass to  Pathology.  Frozen section showed squamous cell carcinoma.   INDICATIONS FOR PROCEDURE:  The patient is a 77 year old male, I saw  number of months ago due to a large fungating bleeding posterior anal  canal mass.  I recommended exam under anesthesia with biopsy.  Unfortunately, it has taken a number of months to get him cleared for  surgery, he presents now for biopsy of this.   DESCRIPTION OF PROCEDURE:  The patient was brought to the operating room  and placed supine.  After induction of LMA anesthesia, he was placed in  stirrups.  The perineum was prepped and draped in sterile fashion.  This  mass was obviously visible in the posterior midline of his anal canal  and he is fungating.  It has ulcerated and bleeding.  No signs of  infection though.  An anoscope was placed.  The mass extends involving  with 0.5 circumference of his anal canal.  It invaded through the  posterior sphincter mechanism.  It stents up about 2 cm above the  dentate line and extends 2 cm below the dentate line.  This was biopsied  and was shown to be a squamous  cell carcinoma, which was invasive.  Hemostasis was achieved.  Surgicel  and Gelfoam were  placed in the wound.  I injected 10 mL of 0.25%  Sensorcaine around this.  He was taken off stirrups, extubated and taken  to recovery room in satisfactory condition.  All final counts of sponge,  needle and found be correct.       [name], M.D.  Electronically Signed     TAC/MEDQ  D:  04/25/2008  T:  04/25/2008  Job:  [id number]   cc:   Lindaann Slough, M.D.

## 2010-12-04 NOTE — Assessment & Plan Note (Signed)
[facility]                            CARDIOLOGY OFFICE NOTE   [name], [name]                       MRN:          [id number]  DATE:05/12/2008                            DOB:          [birth year]    [name] is seen for cardiology followup.  He has a permanent  pacemaker.  He has no proven coronary artery disease.  He had a Myoview  in April 2009 showing diaphragmatic attenuation.  We cannot absolutely  rule out an infarct in the past.  There was no ischemia.  The ejection  fraction was in the 57% range.  He has had a mass diagnosed in his  rectum and he is going to the cancer center soon.  His cardiac status is  stable.  He is not having chest pain or significant shortness of breath.   PAST MEDICAL HISTORY:   ALLERGIES:  No known drug allergies.   MEDICATIONS:  He is not on any significant meds at this time.   OTHER MEDICAL PROBLEMS:  See the list below.   REVIEW OF SYSTEMS:  He is not having any GU symptoms.  He has no fever  or chills.  There are no rashes.  His review of systems otherwise is  negative.   PHYSICAL EXAMINATION:  VITAL SIGNS:  Weight is 133 pounds.  Blood  pressure is 158/92 with a pulse of 83.  GENERAL:  The patient is oriented to person, time and place.  Affect is  normal.  HEENT:  No xanthelasma.  He has normal extraocular motion.  He is thin.  NECK:  There are no carotid bruits.  There is no jugular venous  distention.  LUNGS:  Clear.  Respiratory effort is not labored.  CARDIAC:  An S1 with an S2.  There are no clicks or significant murmurs.  ABDOMEN:  Soft.  EXTREMITIES:  There is no peripheral edema.   EKG is paced.   PROBLEMS:  1. History of symptomatic bradycardia with complete heart block and      pacemaker was placed.  2. Status post permanent placement, stable and being followed by our      group.  3. History of hypertension.  4. Anal mass for which he will be going soon to the cancer center.  5.  History of no significant ischemia on a Myoview in 2009.  6. History of normal left ventricular function.  There is abnormal      septal motion due to his pacemaker.  7. Mild-to-moderate tricuspid regurgitation.  8. Evidence of some pulmonary hypertension.  This is at least      moderate.  His PA pressure by echo was in the range of 60 mmHg.  9. Chronic obstructive pulmonary disease.  10.History of prostate cancer.  11.History of a right arm fracture requiring surgery in the past.  12.Status post appendectomy.  13.Mild aortic valve calcification and mild aortic insufficiency.   Mr. Barb cardiac status is stable.  If he can undergo any type of  treatment, he needs for his anal mass.     Luis Abed, MD, [facility]  Electronically Signed    JDK/MedQ  DD: 05/12/2008  DT: 05/12/2008  Job #: [id number]   cc:   Lindaann Slough, M.D.  Clyda Greener, MD  [facility]

## 2010-12-04 NOTE — Op Note (Signed)
[name], [name]              ACCOUNT NO.:  [id number]   MEDICAL RECORD NO.:  [id number]          PATIENT TYPE:  INP   LOCATION:  2908                         FACILITY:  [facility]   PHYSICIAN:  Doylene Canning. Ladona Ridgel, MD    DATE OF BIRTH:  Dec 15, 1929   DATE OF PROCEDURE:  10/19/2007  DATE OF DISCHARGE:                               OPERATIVE REPORT   PROCEDURE:  Implantation of dual chamber pacemaker.   INDICATIONS FOR PROCEDURE:  Symptomatic complete heart block.   INTRODUCTION:  The patient is a 77 year old man with a history of  hypertension who presents with shortness of breath and was found to be  in complete heart block with a narrow QRS escape of between 35-40 beats  per minute.  He is now referred for permanent pacemaker insertion.   DESCRIPTION OF PROCEDURE:  After informed consent was obtained, the  patient was taken to the diagnostic EP lab in the fasting state.  After  the usual preparation and draping, intravenous Fentanyl and midazolam  were given for sedation.  30 mL of lidocaine was infiltrated in the left  intraclavicular region.  The 5 cm incision was carried out over this  region and electrocautery was utilized to dissect down to the fascial  plane.  10 mL of contrast was injected into the left upper extremity  venous system after initial attempts demonstrated inability to puncture  the vein.  The subclavian vein was then punctured after 10 mL of  contrast demonstrated the vein to be patent.  The Medtronic model 5076  58 cm active fixation pacing lead serial number [id number] was advanced  into the right ventricle.  The Medtronic model 5076 52 cm active  fixation pacing lead serial number [id number] was advanced into the  right atrium.  Mapping was carried out at the final site.  The R waves  were 8.  The pacing impedance in the ventricle was 744 ohms.  The  threshold was 0.5 volts at 0.5 milliseconds.  Active fixation  demonstrated a nice injury current.  10-volt  pacing did not stimulate  the diaphragm.  With the ventricular lead in satisfactory position,  attention was then turned to placement of the atrial lead which was  placed in the anterolateral portion of the right atrium where P waves  were 2 and the pacing impedance was 495 ohms and the threshold 1.5 volts  at 0.5 milliseconds.  Again 10-volt pacing did not stimulate the  diaphragm.  With both atrial and ventricular leads in satisfactory  position, they were secured to the subpectoralis fascia with figure-of-  eight silk suture.  Sew-in sleeve was secured with silk suture.  Electrocautery was utilized to make the subcutaneous pocket and  Kanamycin irrigation was utilized to irrigate the pocket.  Electrocautery was utilized to assure hemostasis.  The Medtronic Sensia  dual chamber pacemaker serial number [id number] was connected to the  atrial and ventricular leads and placed back in the subcutaneous pocket.  Generator was secured with silk suture.  Additional Kanamycin was  utilized to irrigate the pocket and the incision closed with a layer of  3-0 Vicryl followed by a layer of 4-0 Vicryl.  Benzoin was painted on  the skin.  Steri-Strips were applied.  A pressure dressing was placed  and the patient was returned to his room in satisfactory condition.   COMPLICATIONS:  There were no immediate procedure complications.   RESULTS:  This demonstrates successful implantation of a Medtronic dual  chamber pacemaker in a patient with complete heart block.      Doylene Canning. Ladona Ridgel, MD  Electronically Signed     GWT/MEDQ  D:  10/19/2007  T:  10/19/2007  Job:  [id number]

## 2010-12-04 NOTE — Discharge Summary (Signed)
[name], [name]              ACCOUNT NO.:  [id number]   MEDICAL RECORD NO.:  [id number]          PATIENT TYPE:  INP   LOCATION:  3707                         FACILITY:  [facility]   PHYSICIAN:  Luis Abed, MD, FACCDATE OF BIRTH:  30-Dec-1929   DATE OF ADMISSION:  10/18/2007  DATE OF DISCHARGE:  10/20/2007                               DISCHARGE SUMMARY   ADDENDUM   This concerns echocardiogram, which was not available at the time of the  first dictation.  This was done October 20, 2007.  The study shows that  the ejection fraction of left ventricle is normal.  There are no left  ventricular regional wall motion abnormalities.  The left atrium is  mildly dilated.  There is mild right ventricular hypertrophy, mild-to-  moderate tricuspid regurgitation, right atrium mildly dilated.  There  was evidence of pulmonary hypertension.  Also, I would make mention the  fact that the patient has home health set up as an outpatient to help  with medications and tasks at home, and this is with [facility].  There was also some thought that the patient will be going to [facility] to visit his sister in the near future.  We will try to  bridge care down there if indeed this is going to happen.  At the  current time, he has followup with [facility]; however, stress  test on October 27, 2007, at 9:15; Dr. Eden Emms followup on October 29, 2007, at  9 o'clock; [facility] Wednesday, November 04, 2007, at 9; and to see Dr.  Ladona Ridgel on Tuesday, February 09, 2008, at 11:10.      Maple Mirza, [place]      Luis Abed, MD, [facility]  Electronically Signed    GM/MEDQ  D:  10/20/2007  T:  10/21/2007  Job:  [id number]   cc:   Doylene Canning. Ladona Ridgel, MD  Noralyn Pick Eden Emms, MD, [facility]

## 2010-12-04 NOTE — Assessment & Plan Note (Signed)
[facility]                            CARDIOLOGY OFFICE NOTE   BRYAN, GOIN                       MRN:          [id number]  DATE:11/18/2007                            DOB:          1930/06/07    Mr. Posten had presented to [facility] with shortness of  breath.  He had complete heart block.  He received a temporary pacemaker  followed by permanent pacemaker.  Blood pressure was elevated and this  was treated; he was stabilized and discharged home.  Arrangements were  made for him to have an adenosine Myoview and this was done on November 04, 2007.  Study showed ejection fraction of 57%.  There was the possibility  of some diaphragmatic attenuation.  Prior infarct cannot be ruled out.  There was no significant ischemia.  There was some septal hypokinesis  that could be due to pacing function.  We know from an echo done on  October 20, 2007 that there was abnormal septal motion probably related to  pacemaker.  There was mild aortic valve calcification and mild aortic  regurgitation.  He is back now today and cardiac status is relatively  stable.   PAST MEDICAL HISTORY:   ALLERGIES:  NO KNOWN DRUG ALLERGIES.   MEDICATIONS:  Lisinopril/hydrochlorothiazide.   OTHER MEDICAL PROBLEMS:  See the list below.   REVIEW OF SYSTEMS:  He mentions that he may have some discomfort in his  rectum.  Otherwise, his review of systems is negative.   PHYSICAL EXAM:  Weight is 123 pounds.  Blood pressure is 114/72.  Pulse  is 72.  The patient is oriented to person, time and place.  Affect is normal.  HEENT:  Reveals no xanthelasma.  He has normal extraocular motion.  He  has poor dentition.  There are no carotid bruits.  There is no jugular venous distention.  LUNGS:  Clear.  Respiratory effort is not labored.  CARDIAC:  Exam reveals an S1 with an S2.  There are no clicks or  significant murmurs.  There is no significant peripheral edema.    PROBLEMS:  1. Chronic obstructive pulmonary disease.  2. Prostate cancer.  3. History of a right arm fracture with some surgery.  4. Status post appendectomy.  5. Overall good systolic left ventricular function with septal      abnormality probably related to pacing.  6. Mild aortic valve calcification and mild aortic insufficiency.  7. Status post complete heart block with the patient receiving a      permanent pacemaker; he received a Medtronic pacemaker; it was a      Medtronic dual-chamber pacemaker for complete heart block.  8. Some discomfort in his rectum.   The patient's cardiac status is stable.  Further cardiac workup is not  needed.  We will help the patient arrange for early followup visit with  Dr. Brunilda Payor concerning his prostate.  He needs to see his primary care  physician.     Luis Abed, MD, [facility]  Electronically Signed    JDK/MedQ  DD: 11/18/2007  DT: 11/18/2007  Job #:  [id number]   cc:   Clyda Greener MD  Lindaann Slough, M.D.

## 2010-12-04 NOTE — H&P (Signed)
[name], [name]              ACCOUNT NO.:  [id number]   MEDICAL RECORD NO.:  [id number]          PATIENT TYPE:  INP   LOCATION:  2908                         FACILITY:  [facility]   PHYSICIAN:  Unice Cobble, MD     DATE OF BIRTH:  Jul 04, 1930   DATE OF ADMISSION:  10/18/2007  DATE OF DISCHARGE:                              HISTORY & PHYSICAL   CHIEF COMPLAINT:  Shortness of breath   HISTORY OF PRESENT ILLNESS:  This is a 77 year old black male with a  history of COPD and prostate cancer, who was awakened from sleep tonight  with shortness of breath.  The patient has had no chest pain,  presyncope, syncope, nausea, vomiting or diaphoresis.  He denies edema,  orthopnea or PND.  He was feeling well prior to going to bed.  He went  outside to get fresh air, but as it did not help, he came to the  emergency department, where is found to be in third-degree heart block  with a high junctional escape rhythm.   ALLERGIES:  No known drug allergies.   MEDICATIONS:  Vitamins.   PAST MEDICAL HISTORY:  1. COPD.  2. Prostate cancer.  3. Right arm fracture, status post surgery.  4. Status post appendectomy.   SOCIAL HISTORY:  He lives in [place] alone.  He is a retired  Brewing technologist.  He has smoked tobacco for years and years, but is  down to 2 cigarettes a day and occasional pipe.  No alcohol or drugs.   FAMILY HISTORY:  Noncontributory.   REVIEW OF SYSTEMS:  He has had hand wasting for years and years.  He  complains of some mild perianal itching and bulging.  Otherwise, a  complete review of systems was done and found to be negative except as  stated in the HPI.   HISTORY OF PRESENT ILLNESS:  VITAL SIGNS:  His temperature is 98 with a  pulse of 35, the respiratory rate is 22 and his blood pressure is  197/64. His O2 SATs are 94% on 2 L.  GENERAL:  This is a thin, almost cachectic African American male in no  acute distress.  HEENT:  PERRLA, EOMI.  MMM.  Dentition is  poor.  Oropharynx is without  erythema or exudates.  NECK:  Supple without lymphadenopathy, thyromegaly or bruits.  He has  JVD to mid neck.  HEART:  Regular rate and rhythm, although bradycardic.  Normal S1 and  S2.  No murmurs, gallops or rubs.  PMI is nondisplaced.  Pulses 2+ and  equal without bruits.  LUNGS:  Crackles in bilateral bases.  SKIN:  Without rashes or lesions.  ABDOMEN:  Soft and nontender with normal bowel sounds.  No rebound or  guarding.  Negative hepatosplenomegaly.  EXTREMITIES:  Show no cyanosis  or clubbing.  He has trace edema bilaterally in the lower extremities.  MUSCULOSKELETAL:  Exam reveals diffuse hand wasting without effusions.  No spine or CVA tenderness.  NEUROLOGICAL:  He is alert and oriented x3  and very pleasant.  His cranial nerves are grossly intact with strength  5/5  all extremities in axial groups with normal sensation throughout.   RADIOLOGY:  Chest x-ray shows evidence of fibrotic changes and COPD.  Otherwise normal.   LABORATORY AND ACCESSORY CLINICAL DATA:  EKG:  His rate has a sinus rate  of 85 beats per minute with a dissociated ventricular rate of 37 beats  per minute.  This ventricular rate is a narrow junctional escape rhythm.  He has a right bundle branch block and a left anterior fascicular block  with this.   Labs show a hemoglobin of 11.6 with a white blood cell count of 9.  His  INR is 1.  D-dimers mildly elevated 0.91.  His basic metabolic panel is  unremarkable.  His initial troponin is negative.   ASSESSMENT AND PLAN:  This is a 77 year old black male with no  significant cardiac history, who presents with shortness of breath and  third-degree heart block with high junctional escape rhythm.  The  patient is hypertensive without too many symptoms of congestive heart  failure at present, as he appears comfortable.  The point of care  laboratories show no evidence of infarct at this time.  He will need a  pacemaker placement  with temporary pacing over the weekend and a  permanent pacemaker to be placed early next week if no reversible change  to be found.  TSH will be checked and myocardial infarction will be  ruled out.  Unice Cobble, MD  Electronically Signed     ACJ/MEDQ  D:  10/18/2007  T:  10/18/2007  Job:  [id number]

## 2011-01-07 ENCOUNTER — Encounter (HOSPITAL_COMMUNITY): Payer: Self-pay

## 2011-01-07 ENCOUNTER — Emergency Department (HOSPITAL_COMMUNITY): Payer: Medicare Other

## 2011-01-07 ENCOUNTER — Encounter: Payer: Self-pay | Admitting: Internal Medicine

## 2011-01-07 ENCOUNTER — Inpatient Hospital Stay (HOSPITAL_COMMUNITY)
Admission: EM | Admit: 2011-01-07 | Discharge: 2011-01-09 | DRG: 292 | Disposition: A | Discharge status: Home Health | Payer: Medicare Other | Attending: Internal Medicine | Admitting: Internal Medicine

## 2011-01-07 DIAGNOSIS — Z950 Presence of cardiac pacemaker: Secondary | ICD-10-CM

## 2011-01-07 DIAGNOSIS — D649 Anemia, unspecified: Secondary | ICD-10-CM | POA: Diagnosis present

## 2011-01-07 DIAGNOSIS — I2789 Other specified pulmonary heart diseases: Secondary | ICD-10-CM | POA: Diagnosis present

## 2011-01-07 DIAGNOSIS — C210 Malignant neoplasm of anus, unspecified: Secondary | ICD-10-CM | POA: Insufficient documentation

## 2011-01-07 DIAGNOSIS — I079 Rheumatic tricuspid valve disease, unspecified: Secondary | ICD-10-CM | POA: Diagnosis present

## 2011-01-07 DIAGNOSIS — F17200 Nicotine dependence, unspecified, uncomplicated: Secondary | ICD-10-CM | POA: Diagnosis present

## 2011-01-07 DIAGNOSIS — E876 Hypokalemia: Secondary | ICD-10-CM | POA: Diagnosis present

## 2011-01-07 DIAGNOSIS — I509 Heart failure, unspecified: Secondary | ICD-10-CM | POA: Diagnosis present

## 2011-01-07 DIAGNOSIS — Z8546 Personal history of malignant neoplasm of prostate: Secondary | ICD-10-CM

## 2011-01-07 DIAGNOSIS — Z85048 Personal history of other malignant neoplasm of rectum, rectosigmoid junction, and anus: Secondary | ICD-10-CM

## 2011-01-07 DIAGNOSIS — I517 Cardiomegaly: Secondary | ICD-10-CM | POA: Diagnosis present

## 2011-01-07 DIAGNOSIS — I080 Rheumatic disorders of both mitral and aortic valves: Secondary | ICD-10-CM | POA: Diagnosis present

## 2011-01-07 DIAGNOSIS — I359 Nonrheumatic aortic valve disorder, unspecified: Secondary | ICD-10-CM

## 2011-01-07 DIAGNOSIS — I5033 Acute on chronic diastolic (congestive) heart failure: Principal | ICD-10-CM | POA: Diagnosis present

## 2011-01-07 DIAGNOSIS — I10 Essential (primary) hypertension: Secondary | ICD-10-CM | POA: Diagnosis present

## 2011-01-07 DIAGNOSIS — D696 Thrombocytopenia, unspecified: Secondary | ICD-10-CM | POA: Diagnosis present

## 2011-01-07 DIAGNOSIS — Z7982 Long term (current) use of aspirin: Secondary | ICD-10-CM

## 2011-01-07 DIAGNOSIS — J441 Chronic obstructive pulmonary disease with (acute) exacerbation: Secondary | ICD-10-CM | POA: Diagnosis present

## 2011-01-07 DIAGNOSIS — R079 Chest pain, unspecified: Secondary | ICD-10-CM | POA: Diagnosis present

## 2011-01-07 HISTORY — DX: Malignant neoplasm of rectum: C20

## 2011-01-07 LAB — LIPID PANEL
LDL Cholesterol: 105 mg/dL — ABNORMAL HIGH (ref 0–99)
Total CHOL/HDL Ratio: 2.6 RATIO
VLDL: 10 mg/dL (ref 0–40)

## 2011-01-07 LAB — CARDIAC PANEL(CRET KIN+CKTOT+MB+TROPI)
CK, MB: 5.4 ng/mL — ABNORMAL HIGH (ref 0.3–4.0)
Total CK: 205 U/L (ref 7–232)

## 2011-01-07 LAB — BASIC METABOLIC PANEL
BUN: 16 mg/dL (ref 6–23)
Calcium: 9 mg/dL (ref 8.4–10.5)
Creatinine, Ser: 1.05 mg/dL (ref 0.50–1.35)
GFR calc Af Amer: >60 mL/min (ref >60)
GFR calc non Af Amer: >60 mL/min (ref >60)
Potassium: 3.7 mEq/L (ref 3.5–5.1)

## 2011-01-07 LAB — HEPATIC FUNCTION PANEL
ALT: 17 U/L (ref 0–53)
AST: 32 U/L (ref 0–37)
Alkaline Phosphatase: 59 U/L (ref 39–117)
Bilirubin, Direct: <0.1 mg/dL (ref 0.0–0.3)
Total Bilirubin: 0.2 mg/dL — ABNORMAL LOW (ref 0.3–1.2)

## 2011-01-07 LAB — DIFFERENTIAL
Basophils Relative: 0 % (ref 0–1)
Eosinophils Absolute: 0.1 10*3/uL (ref 0.0–0.7)
Lymphs Abs: 1.4 10*3/uL (ref 0.7–4.0)
Neutro Abs: 4.5 10*3/uL (ref 1.7–7.7)
Neutrophils Relative %: 70 % (ref 43–77)

## 2011-01-07 LAB — CK TOTAL AND CKMB (NOT AT ARMC)
CK, MB: 4.7 ng/mL — ABNORMAL HIGH (ref 0.3–4.0)
Relative Index: 2.7 — ABNORMAL HIGH (ref 0.0–2.5)
Total CK: 176 U/L (ref 7–232)

## 2011-01-07 LAB — CBC
Platelets: 102 10*3/uL — ABNORMAL LOW (ref 150–400)
RBC: 4.19 MIL/uL — ABNORMAL LOW (ref 4.22–5.81)
WBC: 6.4 10*3/uL (ref 4.0–10.5)

## 2011-01-07 LAB — D-DIMER, QUANTITATIVE: D-Dimer, Quant: 0.97 ug/mL-FEU — ABNORMAL HIGH (ref 0.00–0.48)

## 2011-01-07 LAB — TROPONIN I: Troponin I: <0.3 ng/mL (ref <0.30)

## 2011-01-07 MED ORDER — IOHEXOL 300 MG/ML  SOLN
80.0000 mL | Freq: Once | INTRAMUSCULAR | Status: AC | PRN
  Administered 2011-01-07: 80 mL via INTRAVENOUS

## 2011-01-07 NOTE — Progress Notes (Signed)
Hospital Admission Note Date: 01/07/2011  Patient name: [name] Medical record number: [id number] Date of birth: 06/24/1930 Age: 77 y.o. Gender: male PCP: No primary provider on file.  Medical Service:  Attending physician:    Dr. Margarito Liner Resident [phone]):    Dr. Scot Dock [phone] Resident (R1):    Dr. Saralyn Pilar [phone]  Chief Complaint: shortness of breath  History of Present Illness: Patient is 77 year old male with past medical history outlined below, status post dual chamber pacemaker secondary to third degree heart block in 2009, who presents to the office call the ED with main concern of shortness of breath that woke him up at 3 AM today. He describes similar episodes of shortness of breath in the past and he remembers being told it was secondary to smoking. In addition he describes this shortness of breath similar to shortness of breath he had in 2009 when he had pacemaker placed. She reports sleeping with one pillow and that has not changed over the past year, however he noticed that his shortness of breath it's worse when he lays down and improves with sitting. He denies chest pain, fevers and chills, no abdominal or urinary concerns, no blood in urine or stool. He reports chronic nonproductive cough unchanged over the past year. He also admits to continue his daily smoking and has not tried to cut down. He denies recent sicknesses or hospitalizations, no sick contacts or exposures. No recent changes in appetite, no other systemic concerns of weight loss or night sweats, no significant changes in weight.  Current Outpatient Prescriptions  Medication Sig Dispense Refill  . Calcium Carbonate-Vitamin D (CALTRATE 600+D) 600-400 MG-UNIT per tablet Take 1 tablet by mouth daily.        Marland Kitchen lisinopril (PRINIVIL,ZESTRIL) 20 MG tablet Take 1 tablet (20 mg total) by mouth daily.  30 tablet  11   Facility-Administered Medications Ordered in Other Visits  Medication Dose Route Frequency  Provider Last Rate Last Dose  . iohexol (OMNIPAQUE) 300 MG/ML injection 80 mL  80 mL Intravenous Once PRN Medication Radiologist   80 mL at 01/07/11 0800    Allergies: Review of patient's allergies indicates no known allergies.  Past Medical History  Diagnosis Date  . Bradycardia   . Hypertension   . Tricuspid regurgitation     mild/moderate  . COPD (chronic obstructive pulmonary disease) - PFTs 2008 showed DLCO 67% consistent with emphysematous nature, was recommended Advair and Spiriva but patient refused    . Prostate cancer   . Aortic valve calcification   . Pulmonary HTN   . Rectal cancer   . Rectal cancer     Past Surgical History  Procedure Date   1) status post appendectomy  2) status post dual-chamber pacemaker placement 2009   No family history on file.  History   Social History  . Marital Status: Legally Separated   Occupational History  . retired    Social History Main Topics  . Smoking status: Active amoker  . Smokeless tobacco:  denies   . Alcohol Use:  denies  . Drug Use:  denies    Review of Systems: Constitutional: Denies fever, chills, diaphoresis, appetite change and fatigue.  HEENT: Denies photophobia, eye pain, redness, hearing loss, ear pain, congestion, sore throat, rhinorrhea, sneezing, mouth sores, trouble swallowing, neck pain, neck stiffness and tinnitus.  Respiratory: Positive for SOB, DOE, chronic and nonproductive cough, denies chest tightness, and wheezing.  Cardiovascular: Denies chest pain, palpitations and leg swelling.  Gastrointestinal: Denies nausea, vomiting,  abdominal pain, diarrhea, constipation, blood in stool and abdominal distention.  Genitourinary: Denies dysuria, urgency, frequency, hematuria, flank pain and difficulty urinating.  Musculoskeletal: Denies myalgias, back pain, joint swelling, arthralgias and gait problem.  Skin: Denies pallor, rash and wound.  Neurological: Denies dizziness, seizures, syncope, weakness,  light-headedness, numbness and headaches.  Hematological: Denies adenopathy. Easy bruising, personal or family bleeding history  Psychiatric/Behavioral: Denies suicidal ideation, mood changes, confusion, nervousness, sleep disturbance and agitation  Physical Exam: Temperature 98.2 Fahrenheit Blood pressure 144/72 Heart rate 71 Respiratory rate 18 Oxygen saturation 99% on 2L  Constitutional: Vital signs reviewed.  Patient is a well-nourished in no acute distress and cooperative with exam. Alert and oriented x3.  Head: Normocephalic and atraumatic Ear: TM normal bilaterally Mouth: no erythema or exudates, dry MM Eyes: PERRL, EOMI, conjunctivae normal, Muddy sclera Neck: Supple, Trachea midline normal ROM, No JVD, mass, thyromegaly, or carotid bruit present.  Cardiovascular: distant heart sounds, RRR, S1 normal, S2 normal, no MRG appreciated, pulses symmetric and intact bilaterally Pulmonary/Chest: mild end expiratory wheezing otherwise CTAB, no crackles, no rales, or rhonchi Abdominal: Soft. Non-tender, non-distended, bowel sounds are normal, no masses, organomegaly, or guarding present.  GU: no CVA tenderness Musculoskeletal: No joint deformities, erythema, or stiffness, ROM full and no nontender Neurological: A&O x3, Strenght is normal and symmetric bilaterally, cranial nerve II-XII are grossly intact, no focal motor deficit, sensory intact to light touch bilaterally.  Skin: Warm, dry and intact. No rash, cyanosis, or clubbing.  Ext: no pitting edema   Lab results:  WBC                                      6.4               4.0-10.5         K/uL  RBC                                      4.19       l      4.22-5.81        MIL/uL  Hemoglobin (HGB)                         11.4       l      13.0-17.0        g/dL  Hematocrit (HCT)                         34.4       l      39.0-52.0        %  MCV                                      82.1              78.0-100.0       fL  MCH -                                     27.2  26.0-34.0        pg  MCHC                                     33.1              30.0-36.0        g/dL  RDW                                      14.5              11.5-15.5        %  Platelet Count (PLT)                     102        l      150-400          K/uL   Sodium (NA)                              145               135-145          mEq/L  Potassium (K)                            3.7               3.5-5.1          mEq/L  Chloride                                 108               96-112           mEq/L  CO2                                      29                19-32            mEq/L  Glucose                                  98                70-99            mg/dL  BUN                                      16                6-23             mg/dL  Creatinine                               1.05  0.50-1.35        mg/dL    **Please note change in reference range.**  GFR, Est Non African American            >60               >60              mL/min  GFR, Est African American                >60               >60              mL/min    Oversized comment, see footnote  1  Calcium                                  9.0               8.4-10.5         Mg/dL   Beta Natriuretic Peptide                 857.3      h      0-450            pg/mL   Creatine Kinase, Total                   176               7-232            U/L  CK, MB                                   4.7        h      0.3-4.0          ng/mL  Relative Index                           2.7        h      0.0-2.5  Troponin I                               <0.30             <0.30            Ng/mL   D-Dimer, Fibrin Derivatives              0.97       h      0.00-0.48        ug/mL-FEU  Imaging results:   1) CTA CHEST:    1.  No pulmonary emboli.   2.  Changes of chronic bronchitis and emphysema noted.   3.  No acute findings.  2) CXR:   Emphysema without acute disease.  Assessment &  Plan by Problem:  1) shortness of breath - given patient's prior history of pacemaker placement secondary to third degree heart block, this is certainly worrisome for ACS especially since patient is elderly male with hypertension and is chronic smoker. Differential also includes COPD/asthma exacerbation, pneumonia, pneumothorax. Physical exam findings are significant for very mild end expiratory wheezing heard only anteriorly with no clinical signs of volume  overload, no JVD, no crackles on lung auscultation, no lower extremity pitting edema. However BNP obtained on admission was elevated in the 800s which suggests a component of CHF exacerbation. Chest x-ray was significant for chronic emphysematous changes with no vascular congestion. My suspicion is that this is mostly related to COPD in the setting of continued smoking. Patient has received one dose of Lasix and reports significant improvement in his shortness of breath. A CTA of the chest is negative for PE. Plan - Admit patient to telemetry bed and monitor vitals - Followup on admission labs including liver function test, cardiac enzymes which are being cycled every 8 hours, 12-lead EKG, TSH, alcohol level - Patient has already received Lasix 40 mg IV dose, will hold off on additional Lasix today and will reassess in the morning to determine needed dose - Follow strict I.'s and O's, daily weights - Provide oxygen via nasal cannula, albuterol nebulizer every 4 hours as needed for shortness of breath - Patient will most likely need Advair or Spiriva as recommended previously by pulmonary specialist - Obtain 2-D echo - Cardiology consult  2) hypertension - continue home medication lisinopril at 10 mg once daily and increase the dose as indicated  [employer]

## 2011-01-08 ENCOUNTER — Encounter: Payer: Self-pay | Admitting: Internal Medicine

## 2011-01-08 DIAGNOSIS — I509 Heart failure, unspecified: Secondary | ICD-10-CM

## 2011-01-08 DIAGNOSIS — R0602 Shortness of breath: Secondary | ICD-10-CM

## 2011-01-08 DIAGNOSIS — R079 Chest pain, unspecified: Secondary | ICD-10-CM

## 2011-01-08 LAB — BASIC METABOLIC PANEL
BUN: 14 mg/dL (ref 6–23)
CO2: 34 mEq/L — ABNORMAL HIGH (ref 19–32)
Calcium: 8.8 mg/dL (ref 8.4–10.5)
Creatinine, Ser: 0.87 mg/dL (ref 0.50–1.35)

## 2011-01-08 LAB — CBC
HCT: 37.1 % — ABNORMAL LOW (ref 39.0–52.0)
MCH: 26.9 pg (ref 26.0–34.0)
MCV: 81.7 fL (ref 78.0–100.0)
Platelets: 123 10*3/uL — ABNORMAL LOW (ref 150–400)
RBC: 4.54 MIL/uL (ref 4.22–5.81)
RDW: 14.7 % (ref 11.5–15.5)

## 2011-01-08 LAB — CARDIAC PANEL(CRET KIN+CKTOT+MB+TROPI)
Relative Index: 2.5 (ref 0.0–2.5)
Total CK: 185 U/L (ref 7–232)

## 2011-01-08 LAB — MAGNESIUM: Magnesium: 1.7 mg/dL (ref 1.5–2.5)

## 2011-01-08 NOTE — Progress Notes (Signed)
Addendum to Hospital Admission Note   Patient name:  [name]  Medical record number:  [id number] Date of birth:  1929/10/05   Age: 77 y.o. Gender:  male PCP:    None.    Current Outpatient Medications: Medication Sig  . Calcium Carbonate-Vitamin D (CALTRATE 600+D) 600-400 MG-UNIT per tablet Take 1 tablet by mouth daily.    Marland Kitchen lisinopril (PRINIVIL,ZESTRIL) 20 MG tablet Take 1 tablet (20 mg total) by mouth daily.      Allergies: No Known Allergies   Past Medical History: Diagnosis Date  . Symptomatic bradycardia     and complete heart block - s/p dual chamber Medtronic pacemaker insertion (model [id number]) - followed by [facility]  . Hypertension   . Tricuspid regurgitation     mild/moderate  . COPD (chronic obstructive pulmonary disease)   . Prostate cancer 2009    followed by Dr. Dalene Carrow, pt reports he is s/p radiation/ chemotherapy (pending records)  . Aortic valve calcification   . Pulmonary HTN     moderate - with PA peak pressure 49 mmHg (per 2-D echo 12/2010)  . Rectal cancer 04/2008    rectal mass biopsy showing invasive, poorly differentiated squamous cell carcinoma  . Diastolic dysfunction     grade 1 diastolic dysfunction per 2-D echo (12/2010) , normal LV systolic function with EF 55-60%   Past Surgical History: Procedure Date  . Appendectomy 1957  . Pacemaker insertion 09/2007    Medtronic dual-chamber pacemaker (model [id number]) in setting of sympomatic bradycardia and complete heart block. By Dr. Ladona Ridgel [facility])   Family History: History reviewed. No pertinent family history.  Social History: History   Social History  . Marital Status: Widowed    Number of Children: 4  . Years of Education: 11th grade   Occupational History  . 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   Social History Main Topics  . Smoking status: Current Everyday Smoker -- 0.5 packs/day for 64 years    Types: Cigarettes  . Smokeless tobacco: Never Used  .  Alcohol Use: No  . Drug Use: No  . Sexually Active: None   Social History Narrative   Lives alone, widowed, [employer], kids live out of state, but he has a niece who is local and with whom he is close.          Johnette Abraham, D.O.

## 2011-01-09 LAB — CBC
HCT: 37.1 % — ABNORMAL LOW (ref 39.0–52.0)
Hemoglobin: 12.5 g/dL — ABNORMAL LOW (ref 13.0–17.0)
MCHC: 33.7 g/dL (ref 30.0–36.0)
RBC: 4.52 MIL/uL (ref 4.22–5.81)

## 2011-01-09 LAB — BASIC METABOLIC PANEL
BUN: 16 mg/dL (ref 6–23)
CO2: 33 mEq/L — ABNORMAL HIGH (ref 19–32)
Chloride: 104 mEq/L (ref 96–112)
GFR calc non Af Amer: >60 mL/min (ref >60)
Glucose, Bld: 83 mg/dL (ref 70–99)
Potassium: 4.1 mEq/L (ref 3.5–5.1)
Sodium: 141 mEq/L (ref 135–145)

## 2011-01-13 ENCOUNTER — Encounter: Payer: Self-pay | Admitting: Internal Medicine

## 2011-01-13 NOTE — Progress Notes (Signed)
HOSPITAL DISCHARGE SUMMARY  DATE OF ADMISSION:        01/07/2011  DATE OF DISCHARGE:        01/09/2011   BRIEF HOSPITAL SUMMARY: Pt is a 77 y.o. male who COPD, pulmonary hypertension, chronic CHF, and pacemaker (for hx of symptomatic bradycardia) who presented to [facility] for evaluation of sudden onset shortness of breath on morning of admission, which was determined to be likely secondary to mild COPD exacerbation in setting of ongoing tobacco use and prior refusal of inhaler therapy, versus mild CHF exacerbation, with institution of lasix by cardiology service. Patient was started on albuterol/ spiriva was ambulated on room air, with adequate oxygenation > 94%, therefore determined not to need home oxygen therapy. He was started on 10 day course of doxycycline in setting of possible COPD exacerbation. Cardiac enzymes were cycled with troponins negative x 3. Cardiology did not recommend pacer interrogation as pacer was interrogated in 11/2010 and found to be appropriately functioning. He will have further close follow-up with cardiology as an outpatient.   STUDIES PENDING AT TIME OF DISCHARGE: None.   OTHER FOLLOW-UP ISSUES: 1) BMET - assess electrolytes and renal function as pt just started on lasix, and experienced hypokalemia during hospital course. 2) Smoking cessation - continue to encourage smoking cessation. 3) PFTs - pt should be schedule for outpatient PFTs for evaluation of severity of COPD.  4) HTN - please assess blood pressure control, with escalation of therapy as indicated.    MEDICATIONS ON DISCHARGE: albuterol (PROVENTIL HFA;VENTOLIN HFA) 108 (90 BASE) MCG/ACT inhaler Inhale 1 puff into the lungs every 4 (four) hours as needed.     aspirin 81 MG EC tablet Take 81 mg by mouth daily.     doxycycline (ADOXA) 100 MG tablet Take 100 mg by mouth 2 (two) times daily.     furosemide (LASIX) 20 MG tablet Take 20 mg by mouth 2 (two) times daily.     lisinopril (PRINIVIL,ZESTRIL)  10 MG tablet Take 10 mg by mouth daily.     potassium chloride SA (K-DUR,KLOR-CON) 20 MEQ tablet Take 20 mEq by mouth daily.     tiotropium (SPIRIVA) 18 MCG inhalation capsule Place 18 mcg into inhaler and inhale daily.     Calcium Carbonate-Vitamin D (CALTRATE 600+D) 600-400 MG-UNIT per tablet Take 1 tablet by mouth daily.          Johnette Abraham, D.O.

## 2011-01-14 ENCOUNTER — Other Ambulatory Visit: Payer: Medicare Other

## 2011-01-14 ENCOUNTER — Encounter: Payer: Self-pay | Admitting: Internal Medicine

## 2011-01-14 ENCOUNTER — Ambulatory Visit (INDEPENDENT_AMBULATORY_CARE_PROVIDER_SITE_OTHER): Payer: Medicare Other | Admitting: Internal Medicine

## 2011-01-14 DIAGNOSIS — I10 Essential (primary) hypertension: Secondary | ICD-10-CM

## 2011-01-14 NOTE — Progress Notes (Signed)
  Subjective:    Patient ID: [name], male    DOB: 1930-05-02, 77 y.o.   MRN: [id number]  HPI Patient arrived at 45 minutes late for his appointment. Discharge hospital summary reviewed.  Check again in today to assess for hypokalemia and reschedule an office visit for next week for hospital followup and to establish care.  Review of Systems     Objective:   Physical Exam        Assessment & Plan:

## 2011-01-15 LAB — BASIC METABOLIC PANEL WITH GFR
BUN: 24 mg/dL — ABNORMAL HIGH (ref 6–23)
CO2: 28 mEq/L (ref 19–32)
Chloride: 104 mEq/L (ref 96–112)
Creat: 1.07 mg/dL (ref 0.50–1.35)
GFR, Est Non African American: >60 mL/min (ref >60)
Glucose, Bld: 78 mg/dL (ref 70–99)
Potassium: 4.5 mEq/L (ref 3.5–5.3)

## 2011-01-15 NOTE — Progress Notes (Signed)
This encounter was created in error - please disregard.

## 2011-01-22 ENCOUNTER — Ambulatory Visit (INDEPENDENT_AMBULATORY_CARE_PROVIDER_SITE_OTHER): Payer: Medicare Other | Admitting: Internal Medicine

## 2011-01-22 ENCOUNTER — Encounter: Payer: Self-pay | Admitting: Internal Medicine

## 2011-01-22 DIAGNOSIS — J4489 Other specified chronic obstructive pulmonary disease: Secondary | ICD-10-CM

## 2011-01-22 DIAGNOSIS — C20 Malignant neoplasm of rectum: Secondary | ICD-10-CM

## 2011-01-22 DIAGNOSIS — Z0000 Encounter for general adult medical examination without abnormal findings: Secondary | ICD-10-CM | POA: Insufficient documentation

## 2011-01-22 DIAGNOSIS — Z125 Encounter for screening for malignant neoplasm of prostate: Secondary | ICD-10-CM

## 2011-01-22 DIAGNOSIS — I10 Essential (primary) hypertension: Secondary | ICD-10-CM

## 2011-01-22 DIAGNOSIS — J449 Chronic obstructive pulmonary disease, unspecified: Secondary | ICD-10-CM

## 2011-01-22 DIAGNOSIS — I5189 Other ill-defined heart diseases: Secondary | ICD-10-CM | POA: Insufficient documentation

## 2011-01-22 DIAGNOSIS — I519 Heart disease, unspecified: Secondary | ICD-10-CM

## 2011-01-22 LAB — BASIC METABOLIC PANEL
BUN: 27 mg/dL — ABNORMAL HIGH (ref 6–23)
CO2: 25 mEq/L (ref 19–32)
Calcium: 9.7 mg/dL (ref 8.4–10.5)
Creat: 1.32 mg/dL (ref 0.50–1.35)
Glucose, Bld: 83 mg/dL (ref 70–99)

## 2011-01-22 NOTE — Assessment & Plan Note (Signed)
We are scheduling a return appointment to his oncologist Dr. Dalene Carrow for monitoring since the patient report it has been about a year since his last appointment with her.  We are requesting records from Dr. Dalene Carrow as well as Dr. Luisa Hart in surgery.

## 2011-01-22 NOTE — Patient Instructions (Signed)
We will schedule an appointment for you to return to Dr. Dalene Carrow in oncology.  You are already scheduled to see Dr. Myrtis Ser in cardiology in August.    We have made no changes in your medications today.

## 2011-01-22 NOTE — Assessment & Plan Note (Signed)
Patient declined tdap vaccination today, but is interested in receiving it at a future visit.  We are holding shingles vaccine until we have a clear history of his cancer treatment.  We are also holding off on recommending colonoscopy until we have all of his records.

## 2011-01-22 NOTE — Assessment & Plan Note (Signed)
Pressure today is under good control.  We will continue on the 10mg  lisinopril PO daily for now and re-evaluate at next follow-up with [place] in August.

## 2011-01-22 NOTE — Progress Notes (Deleted)
  Subjective:    Patient ID: [name], male    DOB: 24-Jun-1930, 77 y.o.   MRN: [id number]  HPI    Review of Systems     Objective:   Physical Exam        Assessment & Plan:

## 2011-01-22 NOTE — Progress Notes (Addendum)
I saw patient and discussed his care with resident Dr. Larey Seat.  I agree with the clinical findings and plans as outlined in his note.

## 2011-01-22 NOTE — Progress Notes (Signed)
Patient ID: [name], male DOB: 1929/12/18, 77 y.o. MRN: [id number]   HPI: [name] is a 77 y/o M with a history of COPD, Stage 1 Diastolic LV dysfunction, and squamous cell anal cancer who is here for follow-up after hospitalization.  He was admitted on 01/07/11 to the hospital with shortness of breath and was found to have Stage 1 Diastolic LV dysfunction in combination with COPD.  He was discharged on Lasix 20 mg daily, KCl daily, Spiriva once daily and albuterol for rescue.  He is doing very well since going home, and he reports that has not smoked since 01/06/11.  He has had very little shortness of breath and thinks the Spiriva works well.  He has only used the albuterol once, just to see how it worked, and has not otherwise needed it.  He reports taking all his medications as directed.  No CP, edema, palpitations, fevers, or chills.  He lives alone and a nurse has come once, and he has two children who live locally.  He plans to start doing daily weights once he buys a scale on the way home today.        ROS: Review of Systems - History obtained from the patient  General ROS: negative for - chills, fatigue, fever, weight gain or weight loss ENT ROS: positive for - difficulty hearing (chronic), negative for - headaches or visual changes Hematological and Lymphatic ROS: negative for - bruising, fatigue or weight loss Endocrine ROS: negative for - palpitations, polydipsia/polyuria, skin changes or unexpected weight changes Respiratory ROS: negative for - cough, hemoptysis or shortness of breath Cardiovascular ROS: negative for - chest pain, edema, palpitations or shortness of breath Gastrointestinal ROS: negative for - abdominal pain, change in bowel habits, constipation, diarrhea, nausea/vomiting or stool incontinence Genito-Urinary ROS: negative for - change in urinary stream or urinary frequency/urgency Neurological ROS: negative for - headaches, numbness/tingling or  weakness Dermatological ROS: negative for - rash  PHYSICAL EXAM: Filed Vitals:   01/22/11 0920  BP: 116/75  Pulse: 83  Temp: 97.6 F (36.4 C)   General: alert, well-developed, and cooperative to examination.  Head: normocephalic and atraumatic.  Eyes: vision grossly intact, pupils equal, pupils round, pupils reactive to light, no injection and anicteric. Arcus senilis present bilaterally.  Mouth: pharynx pink and moist, no erythema, and no exudates. Poor dentition. Neck: supple, full ROM, no thyromegaly, no JVD. Lungs: normal respiratory effort, normal breath sounds, no crackles, and no wheezes. Heart: normal rate, regular rhythm, no murmur, no gallop, and no rub.  Abdomen: soft, non-tender, normal bowel sounds, no distention. Pulses: 2+ DP/PT pulses bilaterally Extremities: No cyanosis, clubbing, edema Neurologic: alert & oriented X3, cranial nerves II-XII intact, strength normal in all extremities. Skin: turgor normal and no rashes.   ASSESSMENT AND PLAN:

## 2011-01-22 NOTE — Assessment & Plan Note (Addendum)
Fluid status seems to be under good control since hospital discharge on Lasix 20mg  daily.  Continue potassium supplementation for now.  We will check BMP today to follow potassium level and decide on if a change is necessary.  He is scheduled to follow-up with cardiology in August.

## 2011-01-22 NOTE — Assessment & Plan Note (Signed)
No breathing difficulty since discharge.  Patient reports that the Spiriva is working well, so we will continue that along with the albuterol for rescue use.  We are ordering full PFTs today.

## 2011-01-23 LAB — PSA: PSA: 3.5 ng/mL (ref <=4.00)

## 2011-01-24 ENCOUNTER — Telehealth: Payer: Self-pay | Admitting: *Deleted

## 2011-01-24 ENCOUNTER — Encounter: Payer: Self-pay | Admitting: Internal Medicine

## 2011-01-24 NOTE — Telephone Encounter (Signed)
Attempted to contact pt at the telephone # on chart, the number that pt gave 2 days ago is now disconnected.  Message was left at an alternate # on pt's chart.  Pt needs to be made aware that he should stop the lasix and potassium per Dr Yaakov Guthrie and f/u with MD next  Fri 07/13.  Appointments has been scheduled for 07/13 @ 1:30pm with  Dr Yaakov Guthrie.  He also has PFTs scheuduled the same day, here at [facility] at 12:15pm.

## 2011-01-25 NOTE — Telephone Encounter (Signed)
Still unable to contact pt by phone, letter mailed.Jacob Spittle Cassady7/6/201210:52 AM

## 2011-01-29 NOTE — Telephone Encounter (Signed)
Received call from pt from # [phone].  Pt was instructed to stop taking his lasix and potassium until he comes back in on Friday and discuss results with his physician.  Pt verbalized understanding and spelled out medications names on the bottle.  Pt will also obtain PFTs on 02/01/11 before his 1:30pm appt with Dr Yaakov Guthrie, phonecall complete.Kingsley Spittle Cassady7/10/201211:17 AM

## 2011-01-29 NOTE — Discharge Summary (Signed)
[name], [name]              ACCOUNT NO.:  [id number]  MEDICAL RECORD NO.:  [id number]  LOCATION:  4736                         FACILITY:  [facility]  PHYSICIAN:  Ileana Roup, M.D.  DATE OF BIRTH:  05/27/1930  DATE OF ADMISSION:  01/07/2011 DATE OF DISCHARGE:                         DISCHARGE SUMMARY   DISCHARGE DIAGNOSES: 1. Dyspnea - likely mild COPD exacerbation versus acute mild diastolic     congestive heart failure exacerbation - the patient was previously not on home     COPD medications and continued tobacco abuse. 2. Hypertension. 3. Hypokalemia - resolved, likely in setting of Lasix. 4. History of prostate cancer. 5. History of anal cancer. 6. Pulmonary hypertension - moderate with PA peak pressure 49 mmHg per     2-D echo June 2012. 7. Chronic grade 1 diastolic dysfunction per echocardiogram June 2012. 8. Status post dual chamber Medtronic pacemaker insertion 2009 in the     setting of symptomatic bradycardia.  DISPOSITION AND FOLLOWUP:  The patient is to follow up with [facility] on January 14, 2011 at 2:45 p.m. at which time we request B-met to be checked as the patient was just started on Lasix and did experience hypokalemia during the hospital course.  The patient should also be assessed for medication compliance as he was recently started on multiple new medications during the hospital course.  Lastly the patient should be again counseled on smoking cessation as well as have outpatient PFTs performed to further evaluate the status of his COPD.  The patient is also to follow up with Dr. Myrtis Ser of [facility] on February 22, 2011 at 12 o'clock p.m. at which time we will reevaluate the patient's diastolic heart failure, consider interrogation of pacemaker, and the patient will be scheduled for outpatient Myoview stress test.  As the patient was found to have congestive heart failure during the hospital course, he will be  sent home with home health RN to provide further education. As well he is recommended to check daily weights and to call the clinic if he is having greater than a 3 pound weight gain.  PROCEDURES PERFORMED: 1. Chest x-ray - January 07, 2011 - emphysema without acute disease. 2. CT angiogram of the chest - January 07, 2011 - no pulmonary emboli.     Changes of chronic bronchitis and emphysema noted.  No acute     findings. 3. 2-D echocardiogram - January 07, 2011 - moderate asymmetric septal     hypertrophy with no systolic anterior motion of the mitral valve     and no significant left ventricular outflow tract gradient.     Systolic function normal with an estimated left ventricular     ejection fraction of 55-60%.  Septal bounce consistent with RV     pacing.  Abnormal left intraocular relaxation with a grade 1     diastolic dysfunction.  DISCHARGE MEDICATIONS: 1. Albuterol inhaler 1 puff every 4 hours as needed for shortness of     breath. 2. Aspirin 81 mg chew once by mouth daily. 3. Doxycycline 100 mg tab 1 tablet by mouth twice daily for eight     additional days. 4. Lasix  20 mg by mouth daily. 5. Lisinopril 10 mg by mouth daily. 6. Potassium chloride 20 mEq tabs by mouth daily. 7. Spiriva 18 mcg caps, inhaled contents of 1 capsule once daily. 8. Vitamin D 1 tablet by mouth once daily.  CONSULTATIONS:  [facility]  ADMITTING HISTORY AND PHYSICAL:  The patient is a 77 year old male with a past medical history of hypertension, COPD with ongoing tobacco abuse, remote history of prostate cancer, pulmonary hypertension who presented to [facility] with acute onset shortness of breath that awoke the patient from sleep at 3 o'clock a.m. on the morning of admission.  The patient described a similar episode of shortness of breath in the past which he remembered being secondary to smoking.  He initially described shortness of breath similar to that which was experienced in 2009  when he had the pacemaker placed.  The patient notes that he is sleeping with one pillow. It has not changed over the past year, however, he noticed that his shortness of breath is worse when he lays down and is improved with sitting.  The patient denies chest pain, fevers, chills, abdominal pain, urinary concerns, blood in urine or stools.  The patient reports a chronic nonproductive cough which is unchanged over the past year. He also admits to continued daily smoking and has not tried to cut down. He denies recent sickness or hospitalization. No sick contacts or exposures. No recent changes in his appetite. No other systemic concerns of weight loss, night sweats, significant changes in weight.  ADMITTING PHYSICAL EXAM:  Temperature 98.2, blood pressure 144, 72 heart rate 71, respirations 18, O2 saturation 99% and 2 liters nasal cannula. GENERAL: In no acute distress, cooperative with exam, alert and oriented x3. HEAD:  Normocephalic atraumatic. MOUTH:  No erythema or exudates. Dry moist mucous membranes. EYES:  PERRL, EOMI. Muddy sclerae. NECK: Supple.  Trachea midline. Normal range of motion.  No JVD.  No carotid bruits.  CARDIOVASCULAR: Distant heart sounds regular rate and rhythm.  S1-S2 normal.  PULMONARY: Mild end expiratory wheezes, otherwise clear to auscultation bilaterally.  No crackles, no rales or rhonchi. ABDOMEN:  Soft, nontender, nondistended, normoactive bowel sounds. GU: No CVA tenderness. MUSCULOSKELETAL: Full range of motion. NEUROLOGICAL: Alert and oriented x3.  Cranial nerves II-XII grossly intact. SKIN: Warm, dry and intact.  No rashes. EXTREMITIES: No pretibial edema.  LABS ON ADMISSION:  CBC:  WBC 6.4, hemoglobin 11.4, hematocrit 34.4. Platelets 102. B-met:  Sodium 145 testing 3.7, chloride 108, bicarb 29, glucose 98, BUN 16, creatinine 1.05, calcium 9. Pro BNP: 857.3. Cardiac enzymes:  CK total 176, CK-MB 4.7. Relative index 2.7, troponin less than  0.3. D-dimer 0.97.  HOSPITAL COURSE: 1. Dyspnea - likely mild COPD exacerbation in the setting of ongoing     tobacco abuse versus mild acute diastolic congestive heart failure exacerbation. On admission,     given the patient's history of prior pacemaker placement in the     setting of third degree heart block, we initially wanted to first     rule out acute coronary syndrome or ischemia in this elderly male     with hypertension and chronic tobacco abuse.  Therefore, cardiac     enzymes were cycled x4 with troponins negative times 4, in the     setting of no new changes on EKG.  The patient likely was     experiencing mild COPD exacerbation in the setting of ongoing     tobacco abuse and prior refusal of inhaled  bronchodilators and     anticholinergics.  CTA of the chest confirmed chronic bronchitis     and emphysema, and was negative for acute pulmonary embolism despite elevated D-dimer.       The patient was started on inhaler therapy during     the hospital course and was additionally treated with doxycycline     which he will continue for a total of 10 days.  The patient was     further counseled on smoking cessation.  In the setting of acute     dyspnea and 2-D echocardiogram showing grade 1 diastolic     dysfunction with mildly elevated pro BNP at 800, the patient was     likely in mild acute diastolic heart congestive heart failure     exacerbation for which he initially received Lasix IV.  Cardiology     was also consulted who started the patient on a daily Lasix regimen     with potassium supplementation. Cardiology also recommends     outpatient Myoview in addition to outpatient pacemaker     interrogation, as the patient's pacemaker had just been     interrogated on Nov 22, 2010 and did not warrant re-interrogation     during the hospital course. The patient was provided smoking cessation counseling     and encouraged to quit smoking. He can follow up at our outpatient      clinic for further consideration of medication to help facilitate     this process as needed.  However, declined help at this time. A CBC     should also be checked as the patient had mild normocytic anemia as     well as mild thrombocytopenia on discharge. 2. Hypertension - stable.  Continued on reduced dose lisinopril and     was started on Lasix.  The patient will be monitored as an     outpatient with consideration of escalation of therapy if     indicated. 3. Hypokalemia -  and this is likely in the setting of new albuterol     use as well as Lasix.  Resolved prior to discharge.  The patient     will return home on daily oral potassium supplementation in the     setting of continued need for Lasix.  He will have close monitoring     of his electrolytes with adjustment of medication as indicated. 4. History of prostate cancer status post chemotherapy and radiation     therapy - the patient indicates that he previously had undergone     these therapies, however, the records were requested but not were     not received during the hospital course.  Once he is established in     outpatient clinic, these records will need to be reviewed and     appropriate monitoring tests will need to be conducted. 5. History of anal cancer.  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 6. Grade 1 diastolic dysfunction.  Congestive heart failure with mild     acute component - confirmed on 2-D echocardiogram. Please see #1 for further details.  PHYSICAL EXAMINATION:  Discharge vital signs:  Temperature 98.2, pulse 66, respirations 16, blood pressure 148/74, O2 saturation 94% on room air.  Of note on the day of discharge, the patient was ambulated on ambient air and maintained O2 saturation above 94%, therefore, he is not a candidate for home oxygen therapy.  DISCHARGE LABS:  B-met:  Sodium 141, potassium 4.1, chloride 104, bicarb 33, glucose  83, BUN 16, creatinine 0.90, calcium 8.9. CBC:  WBC 4.3, hemoglobin 12.5, hematocrit 37.1, platelets 126.    ______________________________ Johnette Abraham, DO   ______________________________ Ileana Roup, M.D.    SK/MEDQ  D:  01/09/2011  T:  01/09/2011  Job:  [id number]  Electronically Signed by Johnette Abraham DO on 01/13/2011 10:06:15 AM Electronically Signed by Margarito Liner M.D. on 01/29/2011 11:25:39 AM

## 2011-01-30 ENCOUNTER — Encounter (HOSPITAL_COMMUNITY): Payer: Medicare Other

## 2011-02-01 ENCOUNTER — Ambulatory Visit (INDEPENDENT_AMBULATORY_CARE_PROVIDER_SITE_OTHER): Payer: Medicare Other | Admitting: Internal Medicine

## 2011-02-01 ENCOUNTER — Ambulatory Visit (HOSPITAL_COMMUNITY)
Admission: RE | Admit: 2011-02-01 | Discharge: 2011-02-01 | Disposition: A | Discharge status: Home / Self Care | Payer: Medicare Other | Source: Ambulatory Visit | Attending: Internal Medicine | Admitting: Internal Medicine

## 2011-02-01 ENCOUNTER — Encounter: Payer: Self-pay | Admitting: Internal Medicine

## 2011-02-01 VITALS — BP 106/60 | HR 88 | Temp 97.1°F | Ht 73.0 in | Wt 135.8 lb

## 2011-02-01 DIAGNOSIS — C210 Malignant neoplasm of anus, unspecified: Secondary | ICD-10-CM

## 2011-02-01 DIAGNOSIS — J449 Chronic obstructive pulmonary disease, unspecified: Secondary | ICD-10-CM

## 2011-02-01 DIAGNOSIS — I10 Essential (primary) hypertension: Secondary | ICD-10-CM

## 2011-02-01 DIAGNOSIS — Z0000 Encounter for general adult medical examination without abnormal findings: Secondary | ICD-10-CM

## 2011-02-01 DIAGNOSIS — J4489 Other specified chronic obstructive pulmonary disease: Secondary | ICD-10-CM | POA: Insufficient documentation

## 2011-02-01 LAB — BASIC METABOLIC PANEL
CO2: 24 mEq/L (ref 19–32)
Chloride: 100 mEq/L (ref 96–112)
Glucose, Bld: 88 mg/dL (ref 70–99)

## 2011-02-01 NOTE — Assessment & Plan Note (Addendum)
Referred [name] to oncology for follow-up after his last visit with [place] since he had not been to oncology for a while and their last record indicates he should have returned before now.  He's not yet scheduled, so making sure today that the appointment gets made.  Still pursuing records from surgeon.

## 2011-02-01 NOTE — Patient Instructions (Signed)
Please stop taking Furosemide (Lasix) and Potassium KCL.  Please return to clinic to see Dr. Yaakov Guthrie in 2 weeks.

## 2011-02-01 NOTE — Assessment & Plan Note (Signed)
Under good control on Spiriva.  Will follow-up PFT result.  Return to clinic in 2 weeks.

## 2011-02-01 NOTE — Progress Notes (Signed)
Subjective:   Patient ID: [name], male DOB: 16-Mar-1930, 77 y.o. MRN: [id number]  HPI:  [name] is a 77 y/o M with a history of COPD, Stage 1 Diastolic LV dysfunction, and squamous cell anal cancer who is here for follow-up.  At last visit on 01/22/11 his BMP showed his potassium had risen to 5.1 and his creatinine had risen to 1.32 from 1.07 previously.  We called the patient to hold his Lasix and potassium supplement.  By the time we managed to get in touch it was two days ago, so he has only held these medications for two days now.  He has been doing great since last visit with no SOB, dyspnea on exertion, chest pain, palpitations, or other concerns.  He notices no leg swelling.  He is dizzy on occasion when he stands up in the morning, but this has been the case for a long time.  He quit smoking on June 17th and continues not to smoke.  He had PFTs this morning and the results are pending.     He still has not bought a scale and plans to start doing daily weights once he buys a scale on the way home today.    ROS:  Review of Systems - History obtained from the patient  General ROS: negative for - chills, fatigue, fever, weight gain or weight loss  ENT ROS: positive for - difficulty hearing (chronic), negative for - headaches or visual changes  Hematological and Lymphatic ROS: negative for - fatigue or weight loss  Endocrine ROS: negative for - palpitations, polydipsia/polyuria, skin changes or unexpected weight changes  Respiratory ROS: negative for - cough, hemoptysis or shortness of breath  Cardiovascular ROS: Positive for dizzy when standing up in mornings (long term). negative for - chest pain, edema, palpitations or shortness of breath on exertion. Gastrointestinal ROS: negative for - abdominal pain, change in bowel habits, constipation, diarrhea, nausea/vomiting or stool incontinence  Genito-Urinary ROS: negative for - change in urinary stream or urinary frequency/urgency    Neurological ROS: Positive for- Occasional headache across forehead. negative for - numbness/tingling or weakness  Dermatological ROS: negative for - rash    Objective:   Physical Exam   Filed Vitals:   02/01/11 1323  BP: 106/60  Pulse: 88  Temp: 97.1 F (36.2 C)   Weight 135lbs, same as last visit.    Orthostatic Vitals: Supine: 102/67, Rate 104.  Feels fine Sitting 93/63, Rate 104. Feels fine Standing 98/64, Rate 107.  Feels fine  General: alert, well-developed, and cooperative to examination.  Head: normocephalic and atraumatic.  Eyes: vision grossly intact, pupils equal, pupils round, pupils reactive to light, no injection and anicteric.  Mouth: pharynx pink and moist, no erythema, and no exudates.  Neck: no JVD. Lungs: normal respiratory effort, no accessory muscle use, normal breath sounds, no crackles, and no wheezes. Heart: normal rate, regular rhythm, no murmur, no gallop, and no rub.   Msk: Bilateral symmetric wasting of the 1st dorsal interosseus in both hands.  Decreased sensation in medial aspect of right palm. Scar over medial forearm volarly  R forearm (fell off haywagon as kid).  Bilateral and symmetric soft tissue traction of 4th and 5th digits such that they cannot fully extend. (long term per patient).  Good strength in hands except for thumb adduction (squeezing thumb and forefinger proximally).    Pulses: 2+ DP/PT pulses bilaterally Extremities: No edema at all.  No cyanosis or clubbing. Neurologic: alert & oriented X3, cranial  nerves II-XII intact, strength normal in all extremities.  Skin: turgor normal and no rashes.  Psych: Oriented X3, memory intact for recent and remote, normally interactive, good eye contact, not anxious appearing, and not depressed appearing.    Assessment & Plan:

## 2011-02-01 NOTE — Assessment & Plan Note (Addendum)
Pressure is lower than would like today.  Stopping Lasix.  Due to past high pressures, will continue his long term BP med Lisinopril at current dose.  For next visit will evaluate if this African American patient may better benefit from a calcium channel blocker or thiazide diuretic.  BMET today to monitor renal function and potassium.  Return to clinic in 2 weeks.

## 2011-02-01 NOTE — Progress Notes (Deleted)
  Subjective:    Patient ID: [name], male    DOB: 1929-09-26, 77 y.o.   MRN: [id number]  HPI    Review of Systems     Objective:   Physical Exam        Assessment & Plan:

## 2011-02-01 NOTE — Assessment & Plan Note (Addendum)
Will discuss [place] vaccine with him at next visit since oncology records do not show any apparent contraindication currently.  Will also discuss Tdap with him again.  Will defer colonoscopy until he sees oncology.

## 2011-02-04 NOTE — Progress Notes (Signed)
I saw, examined, and discussed the patient with Dr Wainright and agree with the note contained here.   

## 2011-02-06 ENCOUNTER — Other Ambulatory Visit: Payer: Self-pay | Admitting: Hematology and Oncology

## 2011-02-06 ENCOUNTER — Telehealth: Payer: Self-pay | Admitting: *Deleted

## 2011-02-06 DIAGNOSIS — C210 Malignant neoplasm of anus, unspecified: Secondary | ICD-10-CM

## 2011-02-06 NOTE — Telephone Encounter (Signed)
Received call from [facility] with Dr Lonell Face office (oncology).  They have arranged for pt to obtain labs, chest xray, CT pelvis on July 23rd at 3pm at Dr Mellody Dance office and 4pm for cxr and ct at [facility].  Pt will then f/u with Dr Dalene Carrow on July 25th at 2:45pm.Haynes Giannotti Cassady7/18/20123:07 PM

## 2011-02-07 NOTE — Consult Note (Signed)
NAMEALBIE, [name] NO.:  [id number]  MEDICAL RECORD NO.:  [id number]  LOCATION:  4736                         FACILITY:  [facility]  PHYSICIAN:  Marca Ancona, MD      DATE OF BIRTH:  01-Dec-1929  DATE OF CONSULTATION:  01/08/2011 DATE OF DISCHARGE:                                CONSULTATION   PRIMARY CARDIOLOGIST:  Luis Abed, MD, [facility]  HISTORY OF PRESENT ILLNESS:  This is a 77 year old with history of pacemaker for complete heart block as well as COPD with ongoing smoking who presented with shortness of breath.  At baseline, the patient says he is able to walk on flat ground without dyspnea and he can walk to the store and back.  He can also mow on grass.  On Monday morning, he woke up at 3 in the morning feeling severely short of breath.  He denies any chest pain.  He felt fine on Sunday.  He has not been coughing.  The patient called EMS was taken to the emergency department where he was treated with inhalers and also received Lasix 40 mg IV.  He had a CT of his chest done which ruled out PE and showed no pneumonia.  CT was significant mainly for emphysema.  Today, the patient actually feels back to his normal baseline with his breathing.  MEDICATIONS: 1. Aspirin 81 mg a day. 2. Doxycycline 100 mg twice a day. 3. Heparin 5000 units t.i.d. 4. Lisinopril 10 mg daily. 5. Spiriva metered-dose inhaler.  PAST MEDICAL HISTORY: 1. Complete heart block in 2009, with dual-chamber Medtronic     pacemaker. 2. Hypertension. 3. COPD.  The patient is an active smoker.  He has not been using     inhalers at home. 4. Prostate cancer. 5. History of rectal cancer. 6. Status post appendectomy. 7. Diastolic congestive heart failure.  Echo done yesterday showed EF     55 -60%, moderate asymmetric septal hypertrophy but no LV outflow     tract gradient or systolic anterior motion of the mitral valve.     There is mild RV dilation but normal RV systolic function,  PA     systolic pressure was 49 mmHg.  SOCIAL HISTORY:  The patient lives in [place] alone.  He smokes less than a pack a day now.  FAMILY HISTORY:  No heart disease that the patient knows off.  REVIEW OF SYSTEMS:  All systems were reviewed and were negative except as noted in the history of present illness.  PHYSICAL EXAMINATION:  VITAL SIGNS:  Temperature 98.1, pulse 63-71 paced, blood pressure 137/77, oxygen saturation 90% on 2 liters nasal cannula.  Ins and Outs 640 in, 1650 out for a total negative of minus 1000 yesterday. GENERAL:  This is a thin male in no apparent distress. HEENT:  Normal exam. ABDOMEN:  Soft, nontender.  No hepatosplenomegaly.  Normal bowel sounds. NECK:  No thyromegaly, thyroid nodule.  JVP is slightly elevated at 8-9 cm of water. CARDIOVASCULAR:  Heart regular S1-S2.  No S3, no S4.  There is no murmur.  There is no peripheral edema.  There is no carotid bruit. LUNGS:  Distant breath sounds bilaterally. NEUROLOGIC:  Alert and oriented x3.  Normal affect. SKIN:  Normal exam.  RADIOLOGY:  CT angiogram of the chest showed no PE.  No pneumonia. There was evidence of emphysema.  EKG shows AV sequential pacing. Telemetry was reviewed and showed only AV sequential pacing.  LABORATORY DATA:  White count 4.4, hematocrit 37.1, platelets 123, potassium 3.1, creatinine 0.87.  Cardiac enzymes negative x4.  Pro BNP 857.  TSH normal.  IMPRESSION:  This is a 77 year old with a history of a complete heart block with pacemaker and chronic obstructive pulmonary disease who presented with an episode of severe dyspnea waking up from sleep yesterday. 1. Dyspnea.  The patient feels like he is back to normal with inhaler     and one dose of intravenous Lasix.  His echocardiogram suggest     diastolic congestive heart failure with elevated pulmonary artery     pressure.  It may likely be due to lung disease (chronic     obstructive pulmonary disease).  The patient's pro  BNP was mildly     elevated considering his age.  He appears pale and mildly volume     overloaded.  His cardiac enzymes were negative x4, so I do not     think he had an acute coronary syndrome.  I would suggest starting     him on 20 mg of Lasix p.o. daily with some potassium.  I will     continue this chronically using inhalers at the time of his chronic     obstructive pulmonary disease.  He is to quit smoking and we are to     arrange an outpatient Myoview for him as well. 2. Pacemaker.  The patient's pacemaker function appears normal.     Marca Ancona, MD     DM/MEDQ  D:  01/08/2011  T:  01/09/2011  Job:  [id number]  Electronically Signed by Marca Ancona MD on 02/07/2011 01:29:15 PM

## 2011-02-11 ENCOUNTER — Encounter (HOSPITAL_BASED_OUTPATIENT_CLINIC_OR_DEPARTMENT_OTHER): Payer: Medicare Other | Admitting: Hematology and Oncology

## 2011-02-11 ENCOUNTER — Ambulatory Visit (HOSPITAL_COMMUNITY)
Admission: RE | Admit: 2011-02-11 | Discharge: 2011-02-11 | Disposition: A | Discharge status: Home / Self Care | Payer: Medicare Other | Source: Ambulatory Visit | Attending: Hematology and Oncology | Admitting: Hematology and Oncology

## 2011-02-11 ENCOUNTER — Other Ambulatory Visit: Payer: Self-pay | Admitting: Hematology and Oncology

## 2011-02-11 DIAGNOSIS — C211 Malignant neoplasm of anal canal: Secondary | ICD-10-CM

## 2011-02-11 DIAGNOSIS — K4090 Unilateral inguinal hernia, without obstruction or gangrene, not specified as recurrent: Secondary | ICD-10-CM | POA: Insufficient documentation

## 2011-02-11 DIAGNOSIS — J438 Other emphysema: Secondary | ICD-10-CM | POA: Insufficient documentation

## 2011-02-11 DIAGNOSIS — I10 Essential (primary) hypertension: Secondary | ICD-10-CM | POA: Insufficient documentation

## 2011-02-11 DIAGNOSIS — C210 Malignant neoplasm of anus, unspecified: Secondary | ICD-10-CM | POA: Insufficient documentation

## 2011-02-11 DIAGNOSIS — Z950 Presence of cardiac pacemaker: Secondary | ICD-10-CM | POA: Insufficient documentation

## 2011-02-11 DIAGNOSIS — N400 Enlarged prostate without lower urinary tract symptoms: Secondary | ICD-10-CM | POA: Insufficient documentation

## 2011-02-11 LAB — CMP (CANCER CENTER ONLY)
CO2: 29 mEq/L (ref 18–33)
Calcium: 9.3 mg/dL (ref 8.0–10.3)
Chloride: 99 mEq/L (ref 98–108)
Creat: 1 mg/dl (ref 0.6–1.2)
Glucose, Bld: 85 mg/dL (ref 73–118)
Total Bilirubin: 0.5 mg/dl (ref 0.20–1.60)

## 2011-02-11 LAB — CBC WITH DIFFERENTIAL/PLATELET
BASO%: 0.2 % (ref 0.0–2.0)
Basophils Absolute: 0 10*3/uL (ref 0.0–0.1)
EOS%: 1.9 % (ref 0.0–7.0)
HCT: 34.8 % — ABNORMAL LOW (ref 38.4–49.9)
LYMPH%: 23 % (ref 14.0–49.0)
MCH: 28.1 pg (ref 27.2–33.4)
MCHC: 33.5 g/dL (ref 32.0–36.0)
MONO#: 0.3 10*3/uL (ref 0.1–0.9)
NEUT%: 68.7 % (ref 39.0–75.0)
Platelets: 103 10*3/uL — ABNORMAL LOW (ref 140–400)

## 2011-02-11 LAB — CEA: CEA: 1.8 ng/mL (ref 0.0–5.0)

## 2011-02-11 LAB — LACTATE DEHYDROGENASE: LDH: 187 U/L (ref 94–250)

## 2011-02-11 MED ORDER — IOHEXOL 300 MG/ML  SOLN
100.0000 mL | Freq: Once | INTRAMUSCULAR | Status: AC | PRN
  Administered 2011-02-11: 100 mL via INTRAVENOUS

## 2011-02-18 ENCOUNTER — Encounter: Payer: Self-pay | Admitting: Internal Medicine

## 2011-02-18 ENCOUNTER — Encounter: Payer: Self-pay | Admitting: Cardiology

## 2011-02-18 ENCOUNTER — Ambulatory Visit (INDEPENDENT_AMBULATORY_CARE_PROVIDER_SITE_OTHER): Payer: Medicare Other | Admitting: Internal Medicine

## 2011-02-18 DIAGNOSIS — I10 Essential (primary) hypertension: Secondary | ICD-10-CM

## 2011-02-18 DIAGNOSIS — I519 Heart disease, unspecified: Secondary | ICD-10-CM

## 2011-02-18 DIAGNOSIS — I5189 Other ill-defined heart diseases: Secondary | ICD-10-CM

## 2011-02-18 DIAGNOSIS — Z0000 Encounter for general adult medical examination without abnormal findings: Secondary | ICD-10-CM

## 2011-02-18 DIAGNOSIS — J449 Chronic obstructive pulmonary disease, unspecified: Secondary | ICD-10-CM

## 2011-02-18 DIAGNOSIS — D649 Anemia, unspecified: Secondary | ICD-10-CM | POA: Insufficient documentation

## 2011-02-18 DIAGNOSIS — C210 Malignant neoplasm of anus, unspecified: Secondary | ICD-10-CM

## 2011-02-18 MED ORDER — ASPIRIN 81 MG PO TBEC
81.0000 mg | DELAYED_RELEASE_TABLET | Freq: Every day | ORAL | Status: DC
Start: 2011-02-18 — End: 2011-12-03

## 2011-02-18 NOTE — Progress Notes (Deleted)
  Subjective:    Patient ID: [name], male    DOB: 07/02/1930, 77 y.o.   MRN: [id number]  HPI    Review of Systems     Objective:   Physical Exam        Assessment & Plan:   Preventative health care Pneumovax given today.  Patient had previously refused all vaccines, so starting with just one today after discussing the benefits with him.  Tdap advisable at next visit.  Consider colonoscopy after he returns to oncology.    Diastolic dysfunction No swelling in lower extremities.    Anal cancer It sounds like he missed an appointment last week after having imaging done because he did not know he had an appointment with the doctor two days later, so we will make sure he has another appt scheduled.  He had cxray negative for evidence of malignancy and pelvic CT showed stable stranding in the perirectal and presacral soft tissues and no evidence of metastatic disease in the pelvis.     COPD Reports good compliance with spiriva.  SOB is doing well with albuterol needed just 1-2 times per week.  Still do not have PFT result done before last visit since they did not come over in [facility].  Will call for them tomorrow morning.  Continue current regimen of spiriva and albuterol.    ESSENTIAL HYPERTENSION, BENIGN BP low today.  Has not been too high in over a year.  Will stop lisinopril.  Follow-up 4-6 weeks to see if his pressure is ok with this medication.    Anemia Hgb 11.5 last week in oncology labs.  Previous labs from June hospital show Hgb 11.5,12.2, and 12.5 during that hospitalization.  Past Hgb values include 12.6 (02/12/10), 13.4 (08/14/09), and 11.6 (09/2007).  It appears that he has been anemic at times for multiple years.  I cannot find any evidence that this has been worked up.  Will communicate with oncology to decide which of [name] physicians will work up his anemia.

## 2011-02-18 NOTE — Assessment & Plan Note (Addendum)
It sounds like he missed an appointment last week after having imaging done because he did not know he had an appointment with the doctor two days later, so we will make sure he has another appt scheduled.  He had cxray negative for evidence of malignancy and pelvic CT showed stable stranding in the perirectal and presacral soft tissues and no evidence of metastatic disease in the pelvis.

## 2011-02-18 NOTE — Assessment & Plan Note (Addendum)
Reports good compliance with spiriva.  SOB is doing well with albuterol needed just 1-2 times per week.  Still do not have PFT result done before last visit since they did not come over in [facility].  Will call for them tomorrow morning.  Continue current regimen of spiriva and albuterol.

## 2011-02-18 NOTE — Assessment & Plan Note (Addendum)
BP low today.  Has not been too high in over a year.  Will stop lisinopril.  Follow-up 4-6 weeks to see if his pressure is ok with this medication.

## 2011-02-18 NOTE — Assessment & Plan Note (Addendum)
Pneumovax given today.  Patient had previously refused all vaccines, so starting with just one today after discussing the benefits with him.  Tdap advisable at next visit.  Consider colonoscopy after he returns to oncology.

## 2011-02-18 NOTE — Assessment & Plan Note (Signed)
Hgb 11.5 last week in oncology labs.  Previous labs from June hospital show Hgb 11.5,12.2, and 12.5 during that hospitalization.  Past Hgb values include 12.6 (02/12/10), 13.4 (08/14/09), and 11.6 (09/2007).  It appears that he has been anemic at times for multiple years.  I cannot find any evidence that this has been worked up.  Will communicate with oncology to decide which of [name] physicians will work up his anemia.

## 2011-02-18 NOTE — Assessment & Plan Note (Signed)
No swelling in lower extremities.

## 2011-02-18 NOTE — Patient Instructions (Signed)
Please return to clinic in 4-6 weeks.

## 2011-02-18 NOTE — Progress Notes (Signed)
Subjective:    Patient ID: [name], male    DOB: 1929/11/10, 77 y.o.   MRN: [id number]  HPI [name] is a 77 year old gentleman with a history of COPD, pacemaker placement for symptomatic bradycardia, and HTN who presents for follow-up.  He has been doing well since his last visit.  He has some shortness of breath at times but it is at his baseline.  He has only needed his albuterol inhaler twice since he last saw [place] over two weeks ago.  No swelling in his lower extremities.  No fever or chills.    He has a history of anal cancer treated with radiation and chemo with no evidence of the disease as of his last oncology visit 18 months ago.  He did have labs and imaging done by his oncologist last week, but it sounds that he inadvertently missed an appointment with her two days after having all the tests done.    Review of Systems ROS: General: no fevers, chills, changes in weight, changes in appetite Skin: no rash HEENT: no blurry vision, hearing changes, sore throat Pulm: no dyspnea, coughing, wheezing CV: no chest pain, palpitations, increased shortness of breath Abd: no abdominal pain, nausea/vomiting, diarrhea/constipation GU: no dysuria, hematuria, polyuria Ext: no arthralgias, myalgias Neuro: no weakness, numbness, or tingling     Objective:   Physical Exam  Filed Vitals:   02/18/11 1353  BP: 93/62  Pulse: 80  Temp:     General: alert, well-developed, and cooperative to examination.  Head: normocephalic and atraumatic.  Eyes: vision grossly intact, pupils equal, pupils round, pupils reactive to light, no injection and anicteric.  Mouth: pharynx pink and moist, no erythema, and no exudates.  Neck: supple, full ROM, no JVD, and no carotid bruits.  Lungs: normal respiratory effort, no accessory muscle use, normal breath sounds, no crackles, and no wheezes. Heart: normal rate, regular rhythm, no murmur, no gallop, and no rub.  Msk: no joint swelling, no joint warmth,  and no redness over joints.  Pulses: 2+ DP/PT pulses bilaterally Extremities: No cyanosis, clubbing, edema Neurologic: alert & oriented X3, cranial nerves II-XII intact, strength normal in all extremities. Skin: turgor normal and no rashes.  Psych: Oriented X3, memory intact for recent and remote, normally interactive, good eye contact, not anxious appearing, and not depressed appearing.       Assessment & Plan:

## 2011-02-20 NOTE — Progress Notes (Signed)
Agree with plans and notes. 

## 2011-02-21 ENCOUNTER — Encounter: Payer: Self-pay | Admitting: Cardiology

## 2011-02-21 DIAGNOSIS — I071 Rheumatic tricuspid insufficiency: Secondary | ICD-10-CM | POA: Insufficient documentation

## 2011-02-21 DIAGNOSIS — I10 Essential (primary) hypertension: Secondary | ICD-10-CM | POA: Insufficient documentation

## 2011-02-21 DIAGNOSIS — R0789 Other chest pain: Secondary | ICD-10-CM | POA: Insufficient documentation

## 2011-02-21 DIAGNOSIS — Z950 Presence of cardiac pacemaker: Secondary | ICD-10-CM | POA: Insufficient documentation

## 2011-02-21 DIAGNOSIS — I359 Nonrheumatic aortic valve disorder, unspecified: Secondary | ICD-10-CM | POA: Insufficient documentation

## 2011-02-22 ENCOUNTER — Encounter: Payer: Self-pay | Admitting: Cardiology

## 2011-02-22 ENCOUNTER — Ambulatory Visit (INDEPENDENT_AMBULATORY_CARE_PROVIDER_SITE_OTHER): Payer: Medicare Other | Admitting: Cardiology

## 2011-02-22 ENCOUNTER — Other Ambulatory Visit: Payer: Self-pay | Admitting: Internal Medicine

## 2011-02-22 ENCOUNTER — Encounter (INDEPENDENT_AMBULATORY_CARE_PROVIDER_SITE_OTHER): Payer: Medicare Other | Admitting: *Deleted

## 2011-02-22 DIAGNOSIS — I5189 Other ill-defined heart diseases: Secondary | ICD-10-CM

## 2011-02-22 DIAGNOSIS — Z950 Presence of cardiac pacemaker: Secondary | ICD-10-CM

## 2011-02-22 DIAGNOSIS — I442 Atrioventricular block, complete: Secondary | ICD-10-CM

## 2011-02-22 DIAGNOSIS — I2789 Other specified pulmonary heart diseases: Secondary | ICD-10-CM

## 2011-02-22 DIAGNOSIS — J449 Chronic obstructive pulmonary disease, unspecified: Secondary | ICD-10-CM

## 2011-02-22 DIAGNOSIS — R9430 Abnormal result of cardiovascular function study, unspecified: Secondary | ICD-10-CM | POA: Insufficient documentation

## 2011-02-22 DIAGNOSIS — I519 Heart disease, unspecified: Secondary | ICD-10-CM

## 2011-02-22 DIAGNOSIS — I272 Pulmonary hypertension, unspecified: Secondary | ICD-10-CM | POA: Insufficient documentation

## 2011-02-22 NOTE — Assessment & Plan Note (Signed)
COPD is stable.  No further workup.

## 2011-02-22 NOTE — Assessment & Plan Note (Signed)
His volume status appears to be stable.  No further workup.

## 2011-02-22 NOTE — Patient Instructions (Signed)
Your physician recommends that you schedule a follow-up appointment in: 6 months with Dr Myrtis Ser and 6 months with Dr Ladona Ridgel

## 2011-02-22 NOTE — Assessment & Plan Note (Signed)
His pulmonary hypertension appears to be stable.  No further workup.

## 2011-02-22 NOTE — Progress Notes (Signed)
HPI Patient is seen for cardiology followup.  I saw him last November, 2011.  He has some pulmonary hypertension but it has been stable.  Historically he has good left ventricular function.  He had some shortness of breath recently was seen in the emergency room.  There was no pulmonary embolus by CT scan.  He had a followup two-dimensional echo on January 07, 2011.  Ejection fraction was stable at 55-60%.  His PA pressure was stable at 49 mmHg.  He feels well now and is not having any significant symptoms.  As part of today's evaluation I have reviewed the patient's last primary care clinic note.  I've also found his echo from June, 2012.  I reviewed the emergency room note from June, 2012. No Known Allergies  Current Outpatient Prescriptions  Medication Sig Dispense Refill  . albuterol (PROVENTIL HFA;VENTOLIN HFA) 108 (90 BASE) MCG/ACT inhaler Inhale 1 puff into the lungs every 4 (four) hours as needed.       Marland Kitchen aspirin 81 MG EC tablet Take 1 tablet (81 mg total) by mouth daily.  30 tablet  11  . Calcium Carbonate-Vitamin D (CALTRATE 600+D) 600-400 MG-UNIT per tablet Take 1 tablet by mouth daily.        Marland Kitchen tiotropium (SPIRIVA) 18 MCG inhalation capsule Place 18 mcg into inhaler and inhale daily.          History   Social History  . Marital Status: Legally Separated    Spouse Name: N/A    Number of Children: 4  . Years of Education: 11th grade   Occupational History  . 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   Social History Main Topics  . Smoking status: Former Smoker -- 0.5 packs/day for 64 years    Types: Cigarettes    Quit date: 01/06/2011  . Smokeless tobacco: Never Used   Comment: havent smoked since Fathers Day-trying to quit  . Alcohol Use: No  . Drug Use: No  . Sexually Active: Not on file   Other Topics Concern  . Not on file   Social History Narrative   Lives alone, widowed, [employer], kids live out of state, but he has a niece who is local  and with whom he is close.     No family history on file.  Past Medical History  Diagnosis Date  . Symptomatic bradycardia     and complete heart block - s/p dual chamber Medtronic pacemaker insertion (model [id number]) - followed by [facility]  . Hypertension   . Tricuspid regurgitation     mild/moderate  . COPD (chronic obstructive pulmonary disease)   . Prostate cancer 2009    followed by Dr. Dalene Carrow, pt reports he is s/p radiation/ chemotherapy (pending records)  . Aortic valve calcification   . Pulmonary HTN      PA pressure stable echo, June, 2012, 49 mmHg  moderate - with PA peak pressure 49 mmHg (per 2-D echo 12/2010)  . Rectal cancer 04/2008    rectal mass biopsy showing invasive, poorly differentiated squamous cell carcinoma  . Diastolic dysfunction     grade 1 diastolic dysfunction per 2-D echo (12/2010) , normal LV systolic function with EF 55-60%  . Pacemaker     Bradycardia and AV block, Dr. Ladona Ridgel  . Ejection fraction < 50%     Normal, echo, 2009, 57%, nuclear, 2009, / EF 55-60%, echo, June, 2012  . Chest discomfort     Nuclear, 2009,  no ischemia    Past Surgical History  Procedure Date  . Appendectomy 1957  . Pacemaker insertion 09/2007    Medtronic dual-chamber pacemaker (model [id number]) in setting of sympomatic bradycardia and complete heart block. By Dr. Ladona Ridgel [facility])    ROS  Patient denies fever, chills, headache, sweats, rash, change in vision, change in hearing, chest pain, cough, nausea vomiting, urinary symptoms.  All other systems are reviewed and are negative.  PHYSICAL EXAM Patient is stable.  He is oriented to person time and place affect is normal.  Head is atraumatic.  He has poor dentition.  Lungs are clear.  Respiratory effort is nonlabored.  Cardiac exam reveals S1-S2.  There no clicks or significant murmurs.  The abdomen is soft.  No peripheral edema.  He has a deformity of his right hand that is chronic.  No rashes. Filed Vitals:    02/22/11 1157  BP: 114/71  Pulse: 93  Height: 6\' 1"  (1.854 m)  Weight: 131 lb (59.421 kg)    EKG EKG is done today and reviewed by me.  The rhythm is paced.  ASSESSMENT & PLAN

## 2011-02-22 NOTE — Assessment & Plan Note (Signed)
We will look into the timing of his last pacemaker interrogation.

## 2011-02-25 LAB — PACEMAKER DEVICE OBSERVATION
AL AMPLITUDE: 2 mv
AL THRESHOLD: 0.75 V
BATTERY VOLTAGE: 2.79 V
RV LEAD IMPEDENCE PM: 562 Ohm

## 2011-04-15 LAB — PROTIME-INR
INR: 1
Prothrombin Time: 13.1

## 2011-04-15 LAB — CARDIAC PANEL(CRET KIN+CKTOT+MB+TROPI)
CK, MB: 5.6 — ABNORMAL HIGH
Relative Index: 1.5
Total CK: 113
Total CK: 163
Total CK: 364 — ABNORMAL HIGH
Troponin I: 0.02
Troponin I: 0.03

## 2011-04-15 LAB — APTT: aPTT: 24

## 2011-04-15 LAB — TSH: TSH: 0.399

## 2011-04-15 LAB — DIFFERENTIAL
Basophils Absolute: 0
Eosinophils Relative: 2
Lymphocytes Relative: 25
Monocytes Absolute: 0.8
Monocytes Relative: 8
Neutro Abs: 5.8

## 2011-04-15 LAB — LIPID PANEL
Cholesterol: 122
HDL: 45
LDL Cholesterol: 67
Total CHOL/HDL Ratio: 2.7
Triglycerides: 49

## 2011-04-15 LAB — POCT CARDIAC MARKERS
CKMB, poc: 2.6
Myoglobin, poc: 66.7
Operator id: 133351

## 2011-04-15 LAB — COMPREHENSIVE METABOLIC PANEL
ALT: 37
AST: 71 — ABNORMAL HIGH
Albumin: 2.7 — ABNORMAL LOW
Alkaline Phosphatase: 120 — ABNORMAL HIGH
GFR calc Af Amer: >60
Potassium: 4
Sodium: 145
Total Protein: 6.2

## 2011-04-15 LAB — CBC
Platelets: 123 — ABNORMAL LOW
RDW: 15.4

## 2011-04-15 LAB — B-NATRIURETIC PEPTIDE (CONVERTED LAB): Pro B Natriuretic peptide (BNP): 665 — ABNORMAL HIGH

## 2011-04-22 LAB — CBC
HCT: 40.2
MCHC: 32.1
MCV: 82.2
Platelets: 146 — ABNORMAL LOW
WBC: 9.6

## 2011-04-22 LAB — DIFFERENTIAL
Basophils Relative: 0
Eosinophils Absolute: 0
Eosinophils Relative: 0
Lymphs Abs: 1.3
Monocytes Relative: 6

## 2011-04-22 LAB — COMPREHENSIVE METABOLIC PANEL
ALT: 9
AST: 17
Albumin: 2.9 — ABNORMAL LOW
Alkaline Phosphatase: 52
CO2: 30
GFR calc Af Amer: >60
Glucose, Bld: 88
Total Bilirubin: 0.2 — ABNORMAL LOW

## 2011-04-23 LAB — GLUCOSE, CAPILLARY

## 2011-05-03 NOTE — Progress Notes (Signed)
Addended by: Maura Crandall on: 05/03/2011 01:57 PM   Modules accepted: Orders

## 2011-07-25 ENCOUNTER — Telehealth: Payer: Self-pay | Admitting: Hematology and Oncology

## 2011-07-25 ENCOUNTER — Other Ambulatory Visit: Payer: Self-pay | Admitting: Hematology and Oncology

## 2011-07-25 DIAGNOSIS — C211 Malignant neoplasm of anal canal: Secondary | ICD-10-CM

## 2011-07-25 NOTE — Telephone Encounter (Signed)
Called pt left message, pt scheduled for lab and scan on end of January and see MD on 2/1/3 , also ask pt to get oral contrast before the day of scan

## 2011-08-20 ENCOUNTER — Other Ambulatory Visit (HOSPITAL_COMMUNITY): Payer: Medicare Other

## 2011-08-20 ENCOUNTER — Inpatient Hospital Stay (HOSPITAL_COMMUNITY)
Admission: RE | Admit: 2011-08-20 | Discharge: 2011-08-20 | Payer: Medicare Other | Source: Ambulatory Visit | Attending: Hematology and Oncology | Admitting: Hematology and Oncology

## 2011-08-20 ENCOUNTER — Other Ambulatory Visit: Payer: Medicare Other | Admitting: Lab

## 2011-08-22 ENCOUNTER — Telehealth: Payer: Self-pay | Admitting: Nurse Practitioner

## 2011-08-22 NOTE — Telephone Encounter (Signed)
Noted pt missed CT and lab appointments.  Attempted to call pt re: MD appt tomorrow.  Left message requesting return call.  Also tried to call Jasmine December, emergency contact, however was informed she is no longer at that number.

## 2011-08-23 ENCOUNTER — Telehealth: Payer: Self-pay | Admitting: Nurse Practitioner

## 2011-08-23 ENCOUNTER — Ambulatory Visit: Payer: Medicare Other | Admitting: Hematology and Oncology

## 2011-08-23 NOTE — Telephone Encounter (Signed)
Called pt informed that MD is out of office today.  Requested pt call scheduling department to reschedule lab, CT scan and MD appointments.

## 2011-08-29 ENCOUNTER — Telehealth: Payer: Self-pay | Admitting: Internal Medicine

## 2011-08-29 NOTE — Telephone Encounter (Signed)
06-25-12 lmm @ 1130a to call to set up pacemaker check w/device/mt 08-29-11 lmm @ 1006a due to see Ladona Ridgel in May, since close to that date will just set up with Ladona Ridgel, instead if device/mt

## 2011-10-04 ENCOUNTER — Encounter: Payer: Self-pay | Admitting: Internal Medicine

## 2011-12-03 ENCOUNTER — Ambulatory Visit (INDEPENDENT_AMBULATORY_CARE_PROVIDER_SITE_OTHER): Payer: Medicare Other | Admitting: Internal Medicine

## 2011-12-03 ENCOUNTER — Encounter: Payer: Self-pay | Admitting: Internal Medicine

## 2011-12-03 VITALS — BP 160/82 | HR 80 | Ht 72.0 in | Wt 130.8 lb

## 2011-12-03 DIAGNOSIS — Z950 Presence of cardiac pacemaker: Secondary | ICD-10-CM

## 2011-12-03 DIAGNOSIS — I10 Essential (primary) hypertension: Secondary | ICD-10-CM

## 2011-12-03 DIAGNOSIS — I442 Atrioventricular block, complete: Secondary | ICD-10-CM | POA: Insufficient documentation

## 2011-12-03 LAB — PACEMAKER DEVICE OBSERVATION
ATRIAL PACING PM: 11
BAMS-0001: 175 {beats}/min
BATTERY VOLTAGE: 2.78 V
VENTRICULAR PACING PM: 99.9

## 2011-12-03 NOTE — Assessment & Plan Note (Signed)
His device is working normally. We'll plan to recheck in several months. 

## 2011-12-03 NOTE — Progress Notes (Signed)
HPI [name] returns today for followup. He is a very pleasant 78 year old man with complete heart block status post pacemaker insertion. He has borderline hypertension. At times his blood pressure has been low and his antihypertensive medication was discontinued. Today's blood pressure is a bit high. He denies chest pain, shortness of breath, or syncope. He still smokes a pipe. He denies peripheral edema. No cough or hemoptysis. No Known Allergies   Current Outpatient Prescriptions  Medication Sig Dispense Refill  . Calcium Carbonate-Vitamin D (CALTRATE 600+D) 600-400 MG-UNIT per tablet Take 1 tablet by mouth daily.           Past Medical History  Diagnosis Date  . Symptomatic bradycardia     and complete heart block - s/p dual chamber Medtronic pacemaker insertion (model [id number]) - followed by [facility]  . Hypertension   . Tricuspid regurgitation     mild/moderate  . COPD (chronic obstructive pulmonary disease)   . Prostate cancer 2009    followed by Dr. Dalene Carrow, pt reports he is s/p radiation/ chemotherapy (pending records)  . Aortic valve calcification   . Pulmonary HTN      PA pressure stable echo, June, 2012, 49 mmHg  moderate - with PA peak pressure 49 mmHg (per 2-D echo 12/2010)  . Rectal cancer 04/2008    rectal mass biopsy showing invasive, poorly differentiated squamous cell carcinoma  . Diastolic dysfunction     grade 1 diastolic dysfunction per 2-D echo (12/2010) , normal LV systolic function with EF 55-60%  . Pacemaker     Bradycardia and AV block, Dr. Ladona Ridgel  . Ejection fraction     Normal, echo, 2009, 57%, nuclear, 2009, / EF 55-60%, echo, June, 2012  . Chest discomfort     Nuclear, 2009, no ischemia    ROS:   All systems reviewed and negative except as noted in the HPI.   Past Surgical History  Procedure Date  . Appendectomy 1957  . Pacemaker insertion 09/2007    Medtronic dual-chamber pacemaker (model [id number]) in setting of sympomatic bradycardia and  complete heart block. By Dr. Ladona Ridgel ([facility])     No family history on file.   History   Social History  . Marital Status: Legally Separated    Spouse Name: N/A    Number of Children: 4  . Years of Education: 11th grade   Occupational History  . 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   Social History Main Topics  . Smoking status: Current Everyday Smoker -- 64 years    Types: Pipe  . Smokeless tobacco: Never Used   Comment: May smoke once or twice a week from his pipe.   . Alcohol Use: No  . Drug Use: No  . Sexually Active: Not on file   Other Topics Concern  . Not on file   Social History Narrative   Lives alone, widowed, [employer], kids live out of state, but he has a niece who is local and with whom he is close.      BP 160/82  Pulse 80  Ht 6' (1.829 m)  Wt 130 lb 12.8 oz (59.33 kg)  BMI 17.74 kg/m2  Physical Exam:  Well appearing 78 year old man, NAD HEENT: Unremarkable Neck:  No JVD, no thyromegally Back:  No CVA tenderness, kyphotic Lungs:  Clear with no wheezes, rales, or rhonchi. HEART:  Regular rate rhythm, no murmurs, no rubs, no clicks Abd:  soft, scaphoid positive bowel sounds,  no organomegally, no rebound, no guarding Ext:  2 plus pulses, no edema, no cyanosis, no clubbing Skin:  No rashes no nodules Neuro:  CN II through XII intact, motor grossly intact  DEVICE  Normal device function.  See PaceArt for details.   Assess/Plan:

## 2011-12-03 NOTE — Patient Instructions (Signed)
Your physician wants you to follow-up in: 6 months with device clinic and 12 months with Dr Taylor You will receive a reminder letter in the mail two months in advance. If you don't receive a letter, please call our office to schedule the follow-up appointment.  

## 2011-12-03 NOTE — Assessment & Plan Note (Signed)
His blood pressure is elevated today. I've asked him to keep track of it. He will return in several months. If his blood pressure is elevated, we would reconsider starting antihypertensive medication.

## 2012-04-13 ENCOUNTER — Encounter: Payer: Self-pay | Admitting: Internal Medicine

## 2012-06-02 ENCOUNTER — Encounter: Payer: Self-pay | Admitting: Cardiology

## 2012-06-02 ENCOUNTER — Encounter: Payer: Self-pay | Admitting: Internal Medicine

## 2012-06-02 ENCOUNTER — Ambulatory Visit (INDEPENDENT_AMBULATORY_CARE_PROVIDER_SITE_OTHER): Payer: Medicare Other | Admitting: Cardiology

## 2012-06-02 VITALS — BP 142/106 | HR 85 | Ht 72.0 in | Wt 130.0 lb

## 2012-06-02 DIAGNOSIS — I442 Atrioventricular block, complete: Secondary | ICD-10-CM

## 2012-06-02 DIAGNOSIS — Z950 Presence of cardiac pacemaker: Secondary | ICD-10-CM

## 2012-06-02 LAB — PACEMAKER DEVICE OBSERVATION
AL AMPLITUDE: 2.8 mv
AL THRESHOLD: 0.625 V
RV LEAD THRESHOLD: 0.875 V

## 2012-06-02 NOTE — Progress Notes (Signed)
Device check only. See PaceArt report. 

## 2012-06-02 NOTE — Patient Instructions (Addendum)
Your physician wants you to follow-up in: 6 months with Dr Ladona Ridgel.  You will receive a reminder letter in the mail two months in advance. If you don't receive a letter, please call our office to schedule the follow-up appointment.  Please call us (or have your niece call us) when you get home so that we can update your phone number and emergency contact information.

## 2012-07-02 ENCOUNTER — Encounter (HOSPITAL_COMMUNITY): Payer: Self-pay | Admitting: *Deleted

## 2012-07-02 ENCOUNTER — Emergency Department (HOSPITAL_COMMUNITY): Payer: Medicare Other

## 2012-07-02 ENCOUNTER — Inpatient Hospital Stay (HOSPITAL_COMMUNITY)
Admission: EM | Admit: 2012-07-02 | Discharge: 2012-07-04 | DRG: 291 | Disposition: A | Discharge status: Home / Self Care | Payer: Medicare Other | Attending: Internal Medicine | Admitting: Internal Medicine

## 2012-07-02 DIAGNOSIS — I272 Pulmonary hypertension, unspecified: Secondary | ICD-10-CM | POA: Diagnosis present

## 2012-07-02 DIAGNOSIS — Z79899 Other long term (current) drug therapy: Secondary | ICD-10-CM

## 2012-07-02 DIAGNOSIS — Z9119 Patient's noncompliance with other medical treatment and regimen: Secondary | ICD-10-CM

## 2012-07-02 DIAGNOSIS — F17200 Nicotine dependence, unspecified, uncomplicated: Secondary | ICD-10-CM | POA: Diagnosis present

## 2012-07-02 DIAGNOSIS — Z950 Presence of cardiac pacemaker: Secondary | ICD-10-CM

## 2012-07-02 DIAGNOSIS — J4489 Other specified chronic obstructive pulmonary disease: Secondary | ICD-10-CM | POA: Diagnosis present

## 2012-07-02 DIAGNOSIS — Z681 Body mass index (BMI) 19 or less, adult: Secondary | ICD-10-CM

## 2012-07-02 DIAGNOSIS — Z91199 Patient's noncompliance with other medical treatment and regimen due to unspecified reason: Secondary | ICD-10-CM

## 2012-07-02 DIAGNOSIS — D72829 Elevated white blood cell count, unspecified: Secondary | ICD-10-CM | POA: Diagnosis present

## 2012-07-02 DIAGNOSIS — J441 Chronic obstructive pulmonary disease with (acute) exacerbation: Secondary | ICD-10-CM | POA: Diagnosis present

## 2012-07-02 DIAGNOSIS — R0789 Other chest pain: Secondary | ICD-10-CM

## 2012-07-02 DIAGNOSIS — I5033 Acute on chronic diastolic (congestive) heart failure: Principal | ICD-10-CM | POA: Diagnosis present

## 2012-07-02 DIAGNOSIS — I498 Other specified cardiac arrhythmias: Secondary | ICD-10-CM | POA: Diagnosis present

## 2012-07-02 DIAGNOSIS — D649 Anemia, unspecified: Secondary | ICD-10-CM

## 2012-07-02 DIAGNOSIS — R0989 Other specified symptoms and signs involving the circulatory and respiratory systems: Secondary | ICD-10-CM

## 2012-07-02 DIAGNOSIS — I10 Essential (primary) hypertension: Secondary | ICD-10-CM | POA: Diagnosis present

## 2012-07-02 DIAGNOSIS — I509 Heart failure, unspecified: Secondary | ICD-10-CM | POA: Diagnosis present

## 2012-07-02 DIAGNOSIS — J449 Chronic obstructive pulmonary disease, unspecified: Secondary | ICD-10-CM | POA: Diagnosis present

## 2012-07-02 DIAGNOSIS — I2789 Other specified pulmonary heart diseases: Secondary | ICD-10-CM | POA: Diagnosis present

## 2012-07-02 DIAGNOSIS — E43 Unspecified severe protein-calorie malnutrition: Secondary | ICD-10-CM | POA: Diagnosis present

## 2012-07-02 LAB — CBC
Hemoglobin: 13 g/dL (ref 13.0–17.0)
MCH: 27.1 pg (ref 26.0–34.0)
MCHC: 32.7 g/dL (ref 30.0–36.0)
Platelets: 113 10*3/uL — ABNORMAL LOW (ref 150–400)
RDW: 14.5 % (ref 11.5–15.5)

## 2012-07-02 LAB — BASIC METABOLIC PANEL
BUN: 7 mg/dL (ref 6–23)
Calcium: 9.5 mg/dL (ref 8.4–10.5)
Creatinine, Ser: 0.9 mg/dL (ref 0.50–1.35)
GFR calc Af Amer: >90 mL/min (ref >90)
GFR calc non Af Amer: 78 mL/min — ABNORMAL LOW (ref >90)
Potassium: 3.6 mEq/L (ref 3.5–5.1)

## 2012-07-02 LAB — POCT I-STAT TROPONIN I: Troponin i, poc: 0.06 ng/mL (ref 0.00–0.08)

## 2012-07-02 MED ORDER — ACETAMINOPHEN 325 MG PO TABS: 650.0000 mg | ORAL_TABLET | ORAL | Status: DC | PRN

## 2012-07-02 MED ORDER — SODIUM CHLORIDE 0.9 % IJ SOLN
3.0000 mL | Freq: Two times a day (BID) | INTRAMUSCULAR | Status: DC
  Administered 2012-07-02 – 2012-07-04 (×4): 3 mL via INTRAVENOUS

## 2012-07-02 MED ORDER — SODIUM CHLORIDE 0.9 % IJ SOLN: 3.0000 mL | INTRAMUSCULAR | Status: DC | PRN

## 2012-07-02 MED ORDER — FUROSEMIDE 10 MG/ML IJ SOLN
40.0000 mg | Freq: Once | INTRAMUSCULAR | Status: AC
  Administered 2012-07-02: 40 mg via INTRAVENOUS
  Filled 2012-07-02: qty 4

## 2012-07-02 MED ORDER — IPRATROPIUM BROMIDE 0.02 % IN SOLN
0.5000 mg | Freq: Once | RESPIRATORY_TRACT | Status: AC
  Administered 2012-07-02: 0.5 mg via RESPIRATORY_TRACT
  Filled 2012-07-02: qty 2.5

## 2012-07-02 MED ORDER — SODIUM CHLORIDE 0.9 % IV SOLN: 250.0000 mL | INTRAVENOUS | Status: DC | PRN

## 2012-07-02 MED ORDER — ENOXAPARIN SODIUM 40 MG/0.4ML ~~LOC~~ SOLN
40.0000 mg | SUBCUTANEOUS | Status: DC
  Administered 2012-07-02 – 2012-07-03 (×2): 40 mg via SUBCUTANEOUS
  Filled 2012-07-02 (×3): qty 0.4

## 2012-07-02 MED ORDER — TIOTROPIUM BROMIDE MONOHYDRATE 18 MCG IN CAPS
18.0000 ug | ORAL_CAPSULE | Freq: Every day | RESPIRATORY_TRACT | Status: DC
  Administered 2012-07-02 – 2012-07-04 (×2): 18 ug via RESPIRATORY_TRACT
  Filled 2012-07-02: qty 5

## 2012-07-02 MED ORDER — LEVOFLOXACIN 500 MG PO TABS
500.0000 mg | ORAL_TABLET | Freq: Every day | ORAL | Status: DC
  Administered 2012-07-03: 500 mg via ORAL
  Filled 2012-07-02: qty 1

## 2012-07-02 MED ORDER — ALBUTEROL SULFATE (5 MG/ML) 0.5% IN NEBU
5.0000 mg | INHALATION_SOLUTION | RESPIRATORY_TRACT | Status: DC | PRN
  Administered 2012-07-02: 5 mg via RESPIRATORY_TRACT
  Filled 2012-07-02: qty 0.5

## 2012-07-02 MED ORDER — METHYLPREDNISOLONE SODIUM SUCC 125 MG IJ SOLR
60.0000 mg | Freq: Two times a day (BID) | INTRAMUSCULAR | Status: DC
  Administered 2012-07-02 – 2012-07-03 (×2): 60 mg via INTRAVENOUS
  Filled 2012-07-02 (×3): qty 0.96

## 2012-07-02 MED ORDER — ONDANSETRON HCL 4 MG/2ML IJ SOLN: 4.0000 mg | Freq: Four times a day (QID) | INTRAMUSCULAR | Status: DC | PRN

## 2012-07-02 MED ORDER — METHYLPREDNISOLONE SODIUM SUCC 125 MG IJ SOLR
125.0000 mg | Freq: Once | INTRAMUSCULAR | Status: AC
  Administered 2012-07-02: 125 mg via INTRAVENOUS
  Filled 2012-07-02: qty 2

## 2012-07-02 MED ORDER — NITROGLYCERIN 2 % TD OINT
1.0000 [in_us] | TOPICAL_OINTMENT | Freq: Four times a day (QID) | TRANSDERMAL | Status: DC
  Administered 2012-07-02 – 2012-07-04 (×8): 1 [in_us] via TOPICAL
  Filled 2012-07-02 (×2): qty 30

## 2012-07-02 MED ORDER — FUROSEMIDE 10 MG/ML IJ SOLN
20.0000 mg | Freq: Two times a day (BID) | INTRAMUSCULAR | Status: DC
  Administered 2012-07-03: 20 mg via INTRAVENOUS
  Filled 2012-07-02 (×3): qty 2

## 2012-07-02 NOTE — ED Notes (Signed)
Called respiratory for breathing treatment, however they are tied up on the floor and asked this writer to administer tx.

## 2012-07-02 NOTE — ED Provider Notes (Signed)
History    CSN: [id number] Arrival date & time 07/02/12  1505 First MD Initiated Contact with Patient 07/02/12 1505     Chief Complaint  Patient presents with  . Shortness of Breath   Patient is a 78 y.o. male presenting with shortness of breath. The history is provided by the patient.  Shortness of Breath  The current episode started yesterday. The onset was gradual. The problem occurs continuously. The problem has been gradually worsening. The problem is moderate. The symptoms are relieved by rest. The symptoms are aggravated by activity. Associated symptoms include cough, shortness of breath and wheezing. Pertinent negatives include no chest pain and no fever. There were no sick contacts.  Pt does not use inhalers regularly at home.  He tried to go outside today to see if it would help but he got worse.  He has been coughing up yellow sputum.  He does not use oxygen at home.  Past Medical History  Diagnosis Date  . Symptomatic bradycardia     and complete heart block - s/p dual chamber Medtronic pacemaker insertion (model [id number]) - followed by [facility]  . Hypertension   . Tricuspid regurgitation     mild/moderate  . COPD (chronic obstructive pulmonary disease)   . Prostate cancer 2009    followed by Dr. Dalene Carrow, pt reports he is s/p radiation/ chemotherapy (pending records)  . Aortic valve calcification   . Pulmonary HTN      PA pressure stable echo, June, 2012, 49 mmHg  moderate - with PA peak pressure 49 mmHg (per 2-D echo 12/2010)  . Rectal cancer 04/2008    rectal mass biopsy showing invasive, poorly differentiated squamous cell carcinoma  . Diastolic dysfunction     grade 1 diastolic dysfunction per 2-D echo (12/2010) , normal LV systolic function with EF 55-60%  . Pacemaker     Bradycardia and AV block, Dr. Ladona Ridgel  . Ejection fraction     Normal, echo, 2009, 57%, nuclear, 2009, / EF 55-60%, echo, June, 2012  . Chest discomfort     Nuclear, 2009, no ischemia    Past  Surgical History  Procedure Date  . Appendectomy 1957  . Pacemaker insertion 09/2007    Medtronic dual-chamber pacemaker (model [id number]) in setting of sympomatic bradycardia and complete heart block. By Dr. Ladona Ridgel ([facility])    No family history on file.  History  Substance Use Topics  . Smoking status: Current Every Day Smoker -- 64 years    Types: Pipe  . Smokeless tobacco: Never Used     Comment: May smoke once or twice a week from his pipe.   . Alcohol Use: No      Review of Systems  Constitutional: Negative for fever.  Respiratory: Positive for cough, shortness of breath and wheezing.   Cardiovascular: Negative for chest pain.  All other systems reviewed and are negative.    Allergies  Review of patient's allergies indicates no known allergies.  Home Medications   Current Outpatient Rx  Name  Route  Sig  Dispense  Refill  . CALCIUM CARBONATE-VITAMIN D 600-400 MG-UNIT PO TABS   Oral   Take 1 tablet by mouth daily.            There were no vitals taken for this visit.  Physical Exam  Nursing note and vitals reviewed. Constitutional: No distress.       Elderly  HENT:  Head: Normocephalic and atraumatic.  Right Ear: External ear normal.  Left Ear:  External ear normal.  Eyes: Conjunctivae normal are normal. Right eye exhibits no discharge. Left eye exhibits no discharge. No scleral icterus.  Neck: Neck supple. No tracheal deviation present.  Cardiovascular: Normal rate, regular rhythm and intact distal pulses.   Pulmonary/Chest: Effort normal. No accessory muscle usage or stridor. Not tachypneic. No respiratory distress. He has wheezes. He has no rales.       Prolonged expiration   Abdominal: Soft. Bowel sounds are normal. He exhibits no distension. There is no tenderness. There is no rebound and no guarding.  Musculoskeletal: He exhibits no edema and no tenderness.       kyphotic  Lymphadenopathy:    He has no cervical adenopathy.  Neurological: He  is alert. He has normal strength. No sensory deficit. Cranial nerve deficit:  no gross defecits noted. He exhibits normal muscle tone. He displays no seizure activity. Coordination normal.  Skin: Skin is warm and dry. No rash noted. He is not diaphoretic.  Psychiatric: He has a normal mood and affect.    ED Course  Procedures (including critical care time) ED EKG Rate  108 VENTRICULAR-PACED RHYTHM RECONSTRUCTED PACER SPIKES IN LD(S) II,aVR,V1 ~ bedside recording Rate faster since last tracing    Medications  albuterol (PROVENTIL) (5 MG/ML) 0.5% nebulizer solution 5 mg (5 mg Nebulization Given 07/02/12 1601)  nitroGLYCERIN (NITROGLYN) 2 % ointment 1 inch (1 inch Topical Given 07/02/12 2342)  methylPREDNISolone sodium succinate (SOLU-MEDROL) 125 mg/2 mL injection 60 mg (60 mg Intravenous Given 07/02/12 2329)  levofloxacin (LEVAQUIN) tablet 500 mg (not administered)  tiotropium (SPIRIVA) inhalation capsule 18 mcg (18 mcg Inhalation Given 07/02/12 2112)  methylPREDNISolone sodium succinate (SOLU-MEDROL) 125 mg/2 mL injection 125 mg (125 mg Intravenous Given 07/02/12 1601)  ipratropium (ATROVENT) nebulizer solution 0.5 mg (0.5 mg Nebulization Given 07/02/12 1601)  furosemide (LASIX) injection 40 mg (40 mg Intravenous Given 07/02/12 1745)    Labs Reviewed  PRO B NATRIURETIC PEPTIDE - Abnormal; Notable for the following:    Pro B Natriuretic peptide (BNP) 5035.0 (*)     All other components within normal limits  BASIC METABOLIC PANEL - Abnormal; Notable for the following:    Glucose, Bld 143 (*)     GFR calc non Af Amer 78 (*)     All other components within normal limits  CBC - Abnormal; Notable for the following:    WBC 12.5 (*)     Platelets 113 (*)     All other components within normal limits  POCT I-STAT TROPONIN I  TROPONIN I  URINALYSIS, ROUTINE W REFLEX MICROSCOPIC  TROPONIN I  TROPONIN I  BASIC METABOLIC PANEL   Dg Chest Port 1 View  07/02/2012  *RADIOLOGY REPORT*   Clinical Data: Shortness of breath  PORTABLE CHEST - 1 VIEW  Comparison: 02/11/2011  Findings: The cardiac shadow is stable.  A pacing device is again seen.  Interstitial changes are noted without focal infiltrate likely of a chronic nature.  No sizable effusion is seen.  IMPRESSION: Chronic interstitial changes without acute infiltrate.   Original Report Authenticated By: Alcide Clever, M.D.      1. COPD (chronic obstructive pulmonary disease)   2. CHF (congestive heart failure)   3. Acute on chronic diastolic congestive heart failure   4. Dyspnea on exertion   5. Hypertension       MDM  Pt with recurrent copd exacerbation associated with CHF component.  Likely acute on chronic diastolic chf.  Pt noted some improvement in the ED with  treatment however still wheezing and short of breath after nebulizer treatment and steroids. Will admit for further management.        Celene Kras, MD 07/03/12 [phone]

## 2012-07-02 NOTE — ED Notes (Signed)
Attempted to give report to floor, RN is busy at moment, will call me back.

## 2012-07-02 NOTE — ED Notes (Signed)
Per EMS report pt coming from home with c/o shortness of breath and wheezing that started yesterday and got worse today.Also reports productive cough with yellow sputum. Pt has hx of emphysema.

## 2012-07-02 NOTE — H&P (Signed)
[facility] History and Physical  Misha Antonini ZOX:[id number] DOB: [birth year] DOA: 07/02/2012  Referring physician: Dr Lynelle Doctor PCP: USED TO FOLLOW UP WITH [facility].  Specialists: Dr Ladona Ridgel.   Chief Complaint: shortness of breath since one week.   HPI: [name] is a 78 y.o. male with multiple medical problems including Diastolic CHF, COPD, hypertension, pacemaker for symptomatic bradycardia, came infor DOE, was found to be wheezing on exam on arrival to ED. He was given IV solumedrol, and nebs and his breathing improved. He denies anyc hest pain , palpitations, . He reports he felt dizziness, and had one episode of vomiting. He also reports that he has been out of medications for many months now. He is being admitted to hospitalist service for evaluation and management of chf and copd exacerbation.   Review of Systems: The patient denies anorexia, fever, weight loss,, vision loss, decreased hearing, hoarseness, chest pain, syncope, , peripheral edema, balance deficits, hemoptysis, abdominal pain, melena, hematochezia, severe indigestion/heartburn, hematuria, incontinence, genital sores, muscle weakness, suspicious skin lesions, transient blindness, difficulty walking, depression, unusual weight change, abnormal bleeding, enlarged lymph nodes, angioedema, and breast masses.    Past Medical History  Diagnosis Date  . Symptomatic bradycardia     and complete heart block - s/p dual chamber Medtronic pacemaker insertion (model [id number]) - followed by [facility]  . Hypertension   . Tricuspid regurgitation     mild/moderate  . COPD (chronic obstructive pulmonary disease)   . Prostate cancer 2009    followed by Dr. Dalene Carrow, pt reports he is s/p radiation/ chemotherapy (pending records)  . Aortic valve calcification   . Pulmonary HTN      PA pressure stable echo, June, 2012, 49 mmHg  moderate - with PA peak pressure 49 mmHg (per 2-D echo 12/2010)  . Rectal  cancer 04/2008    rectal mass biopsy showing invasive, poorly differentiated squamous cell carcinoma  . Diastolic dysfunction     grade 1 diastolic dysfunction per 2-D echo (12/2010) , normal LV systolic function with EF 55-60%  . Pacemaker     Bradycardia and AV block, Dr. Ladona Ridgel  . Ejection fraction     Normal, echo, 2009, 57%, nuclear, 2009, / EF 55-60%, echo, June, 2012  . Chest discomfort     Nuclear, 2009, no ischemia   Past Surgical History  Procedure Date  . Appendectomy 1957  . Pacemaker insertion 09/2007    Medtronic dual-chamber pacemaker (model [id number]) in setting of sympomatic bradycardia and complete heart block. By Dr. Ladona Ridgel ([facility])   Social History:  reports that he has been smoking Pipe.  He has never used smokeless tobacco. He reports that he does not drink alcohol or use illicit drugs.  where does patient live--home, by himself No Known Allergies  History reviewed. No pertinent family history.   Prior to Admission medications   Medication Sig Start Date End Date Taking? Authorizing Provider  Calcium Carbonate-Vitamin D (CALTRATE 600+D) 600-400 MG-UNIT per tablet Take 1 tablet by mouth daily.    Yes Historical Provider, MD   Physical Exam: Filed Vitals:   07/02/12 1516 07/02/12 1828  BP: 175/85 140/82  Pulse: 102 100  Temp: 98.4 F (36.9 C) 98.7 F (37.1 C)  TempSrc: Oral Oral  Resp: 26 22  SpO2: 92% 95%    Constitutional: Vital signs reviewed.  Patient is a well-developed and POORLY NOURISHED in no acute distress and cooperative with exam. Alert and oriented x3.  Head: Normocephalic and atraumatic Mouth:  no erythema or exudates, MMM Eyes: PERRL, EOMI, conjunctivae normal, No scleral icterus.  Neck: Supple, Trachea midline normal ROM, No JVD, mass, thyromegaly, or carotid bruit present.  Cardiovascular: RRR, S1 normal, S2 normal, no MRG, pulses symmetric and intact bilaterally Pulmonary/Chest: CTAB, scattered wheezing heard.  Abdominal: Soft.  Non-tender, non-distended, bowel sounds are normal, no masses, organomegaly, or guarding present.  Musculoskeletal: No joint deformities, erythema, or stiffness, ROM full and no nontender Hematology: no cervical, inginal, or axillary adenopathy.  Neurological: A&O x3, Strength is normal and symmetric bilaterally, able to move all his extremities.  Skin: Warm, dry and intact. No rash, cyanosis, or clubbing.  Psychiatric: Normal mood and affect. speech and behavior is normal.      Labs on Admission:  Basic Metabolic Panel:  Lab 07/02/12 1610  NA 143  K 3.6  CL 98  CO2 30  GLUCOSE 143*  BUN 7  CREATININE 0.90  CALCIUM 9.5  MG --  PHOS --   Liver Function Tests: No results found for this basename: AST:5,ALT:5,ALKPHOS:5,BILITOT:5,PROT:5,ALBUMIN:5 in the last 168 hours No results found for this basename: LIPASE:5,AMYLASE:5 in the last 168 hours No results found for this basename: AMMONIA:5 in the last 168 hours CBC:  Lab 07/02/12 1550  WBC 12.5*  NEUTROABS --  HGB 13.0  HCT 39.8  MCV 83.1  PLT 113*   Cardiac Enzymes: No results found for this basename: CKTOTAL:5,CKMB:5,CKMBINDEX:5,TROPONINI:5 in the last 168 hours  BNP (last 3 results)  Basename 07/02/12 1550  PROBNP 5035.0*   CBG: No results found for this basename: GLUCAP:5 in the last 168 hours  Radiological Exams on Admission: Dg Chest Port 1 View  07/02/2012  *RADIOLOGY REPORT*  Clinical Data: Shortness of breath  PORTABLE CHEST - 1 VIEW  Comparison: 02/11/2011  Findings: The cardiac shadow is stable.  A pacing device is again seen.  Interstitial changes are noted without focal infiltrate likely of a chronic nature.  No sizable effusion is seen.  IMPRESSION: Chronic interstitial changes without acute infiltrate.   Original Report Authenticated By: Alcide Clever, M.D.     EKG:  Paced rhythm  Assessment/Plan Active Problems: 1. Acute on  Chronic diastolic HF: probably from medication non compliance -admit to  telemetry - start patient on IV lasix 20 mg BID - 2D echocardiogram ordered - cardiac enzymes. - daily weights and I/Os - repeat pro bnp and CXR in 2 days.  - ACE inhibitors if BP permits .  2. COPD exacerbation: - IV solumedrol - nebs, spiriva, oxygen and levaquin.   3. Hypertension:  - better controlled.   4. DVT prophylaxis: sq lovenox.   5. Leukocytosis: patient is afebrile, no infiltrate on CXR. WILL get UA.      Code Status: full code Family Communication: none at bedside Disposition Plan: in 2 to 3 days.    [facility] Pager [phone]  If 7PM-7AM, please contact night-coverage [email or web address] Password [facility] 07/02/2012, 7:13 PM

## 2012-07-02 NOTE — ED Notes (Signed)
ZOX:[id number]<AV> Expected date:<BR> Expected time:<BR> Means of arrival:Ambulance<BR> Comments:<BR> SOB

## 2012-07-02 NOTE — ED Notes (Signed)
Attempted again to give report to floor, was told RN is still busy with emergency. Will try again in few minutes.

## 2012-07-03 DIAGNOSIS — I509 Heart failure, unspecified: Secondary | ICD-10-CM

## 2012-07-03 DIAGNOSIS — J449 Chronic obstructive pulmonary disease, unspecified: Secondary | ICD-10-CM

## 2012-07-03 DIAGNOSIS — D649 Anemia, unspecified: Secondary | ICD-10-CM

## 2012-07-03 DIAGNOSIS — I5033 Acute on chronic diastolic (congestive) heart failure: Principal | ICD-10-CM

## 2012-07-03 DIAGNOSIS — I10 Essential (primary) hypertension: Secondary | ICD-10-CM

## 2012-07-03 LAB — TROPONIN I: Troponin I: <0.3 ng/mL (ref <0.30)

## 2012-07-03 LAB — URINALYSIS, ROUTINE W REFLEX MICROSCOPIC
Ketones, ur: NEGATIVE mg/dL
Leukocytes, UA: NEGATIVE
Nitrite: NEGATIVE
Protein, ur: NEGATIVE mg/dL
pH: 5 (ref 5.0–8.0)

## 2012-07-03 LAB — BASIC METABOLIC PANEL
CO2: 34 mEq/L — ABNORMAL HIGH (ref 19–32)
Calcium: 9.2 mg/dL (ref 8.4–10.5)
Chloride: 98 mEq/L (ref 96–112)
Creatinine, Ser: 0.94 mg/dL (ref 0.50–1.35)
Glucose, Bld: 217 mg/dL — ABNORMAL HIGH (ref 70–99)
Sodium: 142 mEq/L (ref 135–145)

## 2012-07-03 LAB — URINE MICROSCOPIC-ADD ON

## 2012-07-03 MED ORDER — INSULIN ASPART 100 UNIT/ML ~~LOC~~ SOLN: 0.0000 [IU] | Freq: Three times a day (TID) | SUBCUTANEOUS | Status: DC

## 2012-07-03 MED ORDER — DOXYCYCLINE HYCLATE 100 MG PO TABS
100.0000 mg | ORAL_TABLET | Freq: Two times a day (BID) | ORAL | Status: DC
  Administered 2012-07-03 – 2012-07-04 (×2): 100 mg via ORAL
  Filled 2012-07-03 (×3): qty 1

## 2012-07-03 MED ORDER — ENSURE COMPLETE PO LIQD
237.0000 mL | Freq: Three times a day (TID) | ORAL | Status: DC
  Administered 2012-07-03 – 2012-07-04 (×4): 237 mL via ORAL

## 2012-07-03 MED ORDER — METHYLPREDNISOLONE SODIUM SUCC 40 MG IJ SOLR
40.0000 mg | Freq: Every day | INTRAMUSCULAR | Status: DC
  Administered 2012-07-04: 40 mg via INTRAVENOUS
  Filled 2012-07-03: qty 1

## 2012-07-03 MED ORDER — FUROSEMIDE 10 MG/ML IJ SOLN
20.0000 mg | Freq: Every day | INTRAMUSCULAR | Status: DC
  Administered 2012-07-04: 20 mg via INTRAVENOUS
  Filled 2012-07-03: qty 2

## 2012-07-03 NOTE — Progress Notes (Signed)
INITIAL NUTRITION ASSESSMENT  Pt meets criteria for severe MALNUTRITION in the context of chronic illness as evidenced by pt with visible severe muscle wasting and fat loss in clavicles, upper arm region, and dorsal hand.  DOCUMENTATION CODES Per approved criteria  -Severe malnutrition in the context of chronic illness -Underweight    INTERVENTION: Ensure Complete TID.   NUTRITION DIAGNOSIS: Increased nutrient needs related to shortness of breath with COPD exacerbation as evidenced by MD notes.   Goal: Pt to consume >90% of meals/supplements.   Monitor: Weights, labs, intake   Reason for Assessment: Consult   78 y.o. male  Admitting Dx: Shortness of breath   ASSESSMENT: Pt reports eating 3 meals/day PTA however reports his weight has been down in the past month and is not sure why. Pt is not on any nutritional supplements at home. Pt reports he has been limiting the sodium in his diet r/t CHF. Pt denies any problems chewing or swallowing. Performed nutrition focused physical exam.   Nutrition Focused Physical Exam: Subcutaneous Fat:  Orbital Region: mild/moderate wasting Upper Arm Region: severe wasting Thoracic and Lumbar Region: mild/moderate wasting  Muscle:  Temple Region: mild/moderate wasting Clavicle Bone Region: severe wasting Clavicle and Acromion Bone Region: severe wasting Scapular Bone Region: mild/moderate wasting Dorsal Hand: severe wasting Patellar Region: mild/moderate wasting Anterior Thigh Region: mild/moderate wasting Posterior Calf Region: mild/moderate wasting  Edema: None observed   Height: Ht Readings from Last 1 Encounters:  07/02/12 6\' 1"  (1.854 m)   Weight: Wt Readings from Last 1 Encounters:  07/02/12 124 lb 12.5 oz (56.6 kg)   Ideal Body Weight: 184 lb  % Ideal Body Weight: 67  Wt Readings from Last 10 Encounters:  07/02/12 124 lb 12.5 oz (56.6 kg)  06/02/12 130 lb (58.968 kg)  12/03/11 130 lb 12.8 oz (59.33 kg)  02/22/11 131  lb (59.421 kg)  02/18/11 135 lb 1.6 oz (61.281 kg)  02/01/11 135 lb 12.8 oz (61.598 kg)  01/22/11 135 lb 9.6 oz (61.508 kg)  11/22/10 135 lb (61.236 kg)  05/28/10 137 lb (62.143 kg)  05/30/09 136 lb (61.689 kg)   Usual Body Weight: 130 lb past month  % Usual Body Weight: 95  BMI:  Body mass index is 16.46 kg/(m^2).  Estimated Nutritional Needs: Kcal: 2000-2250 Protein: 100-115g Fluid: 2-2.2L   Diet Order: General  EDUCATION NEEDS: -No education needs identified at this time   Intake/Output Summary (Last 24 hours) at 07/03/12 1458 Last data filed at 07/03/12 0900  Gross per 24 hour  Intake    360 ml  Output    875 ml  Net   -515 ml   Last BM: 12/12  Labs:   Lab 07/03/12 0140 07/02/12 1550  NA 142 143  K 3.3* 3.6  CL 98 98  CO2 34* 30  BUN 10 7  CREATININE 0.94 0.90  CALCIUM 9.2 9.5  MG -- --  PHOS -- --  GLUCOSE 217* 143*    CBG (last 3)  No results found for this basename: GLUCAP:3 in the last 72 hours  Scheduled Meds:   . enoxaparin  40 mg Subcutaneous Q24H  . furosemide  20 mg Intravenous Q12H  . levofloxacin  500 mg Oral Daily  . methylPREDNISolone (SOLU-MEDROL) injection  60 mg Intravenous Q12H  . nitroGLYCERIN  1 inch Topical Q6H  . sodium chloride  3 mL Intravenous Q12H  . tiotropium  18 mcg Inhalation Daily   Continuous Infusions:   Past Medical History  Diagnosis  Date  . Symptomatic bradycardia     and complete heart block - s/p dual chamber Medtronic pacemaker insertion (model [id number]) - followed by [facility]  . Hypertension   . Tricuspid regurgitation     mild/moderate  . COPD (chronic obstructive pulmonary disease)   . Prostate cancer 2009    followed by Dr. Dalene Carrow, pt reports he is s/p radiation/ chemotherapy (pending records)  . Aortic valve calcification   . Pulmonary HTN      PA pressure stable echo, June, 2012, 49 mmHg  moderate - with PA peak pressure 49 mmHg (per 2-D echo 12/2010)  . Rectal cancer 04/2008    rectal  mass biopsy showing invasive, poorly differentiated squamous cell carcinoma  . Diastolic dysfunction     grade 1 diastolic dysfunction per 2-D echo (12/2010) , normal LV systolic function with EF 55-60%  . Pacemaker     Bradycardia and AV block, Dr. Ladona Ridgel  . Ejection fraction     Normal, echo, 2009, 57%, nuclear, 2009, / EF 55-60%, echo, June, 2012  . Chest discomfort     Nuclear, 2009, no ischemia    Past Surgical History  Procedure Date  . Appendectomy 1957  . Pacemaker insertion 09/2007    Medtronic dual-chamber pacemaker (model [id number]) in setting of sympomatic bradycardia and complete heart block. By Dr. Ladona Ridgel [facility])      Levon Hedger MS, RD, LDN [phone] Pager [phone] After Hours Pager

## 2012-07-03 NOTE — Care Management Note (Unsigned)
    Page 1 of 1   07/03/2012     4:37:26 PM   CARE MANAGEMENT NOTE 07/03/2012  Patient:  SA[place]CLA[place], SCHROADER   Account Number:  [id number]  Date Initiated:  07/03/2012  Documentation initiated by:  Lanier Clam  Subjective/Objective Assessment:   ADMITTED W/SOB.COPD.ZO:XWRU     Action/Plan:   FROM HOME.NO PCP,HAS PHARMACY.   Anticipated DC Date:  07/06/2012   Anticipated DC Plan:  HOME W HOME HEALTH SERVICES  In-house referral  PCP / [facility]      DC Planning Services  CM consult      Choice offered to / List presented to:  C-1 Patient           Status of service:  In process, will continue to follow Medicare Important Message given?   (If response is "NO", the following Medicare IM given date fields will be blank) Date Medicare IM given:   Date Additional Medicare IM given:    Discharge Disposition:    Per UR Regulation:  Reviewed for med. necessity/level of care/duration of stay  If discussed at Long Length of Stay Meetings, dates discussed:    Comments:  07/03/12 Treyton Slimp RN,BSN NCM [phone] COPD GOLD PROTOCAL.PROVIDED W/PCP LIST,ENCOURAGED TO CHOOSE PCP.PROVIDED [facility] AGENCY LIST.RECOMMEND HHRN-COPD PROTOCAL.OT-NO F/U.

## 2012-07-03 NOTE — Evaluation (Signed)
Occupational Therapy Evaluation Patient Details Name: [name] MRN: [id number] DOB: October 07, 1929 Today's Date: 07/03/2012 Time: 4098-1191 OT Time Calculation (min): 20 min  OT Assessment / Plan / Recommendation Clinical Impression  this 78 year old man was admitted for copd exacerbation and chf.  Pt has PMH signficant for HTN, pacemaker, and rectal CA.    Pt does not need continued OT; education completed.      OT Assessment  Patient does not need any further OT services    Follow Up Recommendations  No OT follow up    Barriers to Discharge      Equipment Recommendations  None recommended by OT    Recommendations for Other Services    Frequency       Precautions / Restrictions Precautions Precautions: None   Pertinent Vitals/Pain No pain.  sats were difficult to pick up, read in 70s initially but might have been wrong.  Then 94-99% wearing 02 the entire time.    ADL  Upper Body Bathing: Performed;Set up Where Assessed - Upper Body Bathing: Unsupported sitting Lower Body Bathing: Performed;Set up Where Assessed - Lower Body Bathing: Supported sit to stand Upper Body Dressing: Performed;Set up Where Assessed - Upper Body Dressing: Unsupported sitting Lower Body Dressing: Performed;Set up Where Assessed - Lower Body Dressing: Supported sit to stand Toilet Transfer: Simulated;Modified independent Toilet Transfer Method: Stand pivot Toileting - Clothing Manipulation and Hygiene: Simulated;Modified independent Where Assessed - Toileting Clothing Manipulation and Hygiene: Sit to stand from 3-in-1 or toilet Transfers/Ambulation Related to ADLs: Pt was good about taking rest breaks.  Educated on energy conservation.  He does stand in shower at home and doesn't use 02 ADL Comments: Pt set up for adls.  He has washer/dryer in apt and states someone can help him if he needs help with groceries, etc.      OT Diagnosis:    OT Problem List:   OT Treatment Interventions:     OT  Goals    Visit Information  Last OT Received On: 07/03/12 Assistance Needed: +1    Subjective Data  Subjective: I take the bus to get my groceries and take a taxi home Patient Stated Goal: home   Prior Functioning     Home Living Lives With: Alone Type of Home: Apartment Home Access: Level entry Home Layout: One level Home Adaptive Equipment: None Prior Function Level of Independence: Independent Communication Communication: No difficulties Dominant Hand: Right         Vision/Perception     Cognition  Overall Cognitive Status: Appears within functional limits for tasks assessed/performed Arousal/Alertness: Awake/alert Orientation Level: Appears intact for tasks assessed Behavior During Session: [facility] for tasks performed    Extremity/Trunk Assessment Right Upper Extremity Assessment RUE ROM/Strength/Tone: [facility] for tasks assessed Left Upper Extremity Assessment LUE ROM/Strength/Tone: [facility] for tasks assessed Right Lower Extremity Assessment RLE ROM/Strength/Tone: [facility] for tasks assessed Left Lower Extremity Assessment LLE ROM/Strength/Tone: [facility] for tasks assessed     Mobility Bed Mobility Bed Mobility: Supine to Sit Supine to Sit: 7: Independent Transfers Sit to Stand: 6: Modified independent (Device/Increase time) Stand to Sit: 6: Modified independent (Device/Increase time)     Shoulder Instructions     Exercise     Balance     End of Session OT - End of Session Activity Tolerance: Patient tolerated treatment well Patient left: in bed;with call bell/phone within reach  GO     [facility] 07/03/2012, 12:32 PM Marica Otter, OTR/L [phone] 07/03/2012

## 2012-07-03 NOTE — Evaluation (Signed)
Physical Therapy Evaluation Patient Details Name: [name] MRN: [id number] DOB: 22-Jun-1930 Today's Date: 07/03/2012 Time: 7829-5621 PT Time Calculation (min): 15 min  PT Assessment / Plan / Recommendation Clinical Impression  Pt admitted with SOB and acute on chronic diastolic heart failure.  Pt reports he is not on oxygen at home and his breathing is better today.  Pt SaO2 86% on room air, so reapplied 2L O2 [name] for ambulation and sats around 92-95%.  Pt denies SOB with ambulation and agreed to ambulate with staff during hospitalization.  No further acute PT needs identifed at this time.    PT Assessment  Patent does not need any further PT services    Follow Up Recommendations  No PT follow up    Does the patient have the potential to tolerate intense rehabilitation      Barriers to Discharge        Equipment Recommendations  None recommended by PT    Recommendations for Other Services     Frequency      Precautions / Restrictions Precautions Precautions: None   Pertinent Vitals/Pain Pt denies pain.      Mobility  Bed Mobility Bed Mobility: Supine to Sit Supine to Sit: 7: Independent Transfers Transfers: Sit to Stand;Stand to Sit Sit to Stand: 6: Modified independent (Device/Increase time) Stand to Sit: 6: Modified independent (Device/Increase time) Ambulation/Gait Ambulation/Gait Assistance: 6: Modified independent (Device/Increase time) Ambulation Distance (Feet): 350 Feet Assistive device: None Ambulation/Gait Assistance Details: SaO2 92-95% during ambulation, no LOB or unsteadiness observed, pt reports no SOB Gait Pattern: Within Functional Limits  Pt also educated to breath in through nose if he becomes SOB and explained normal SaO2 and need for oxygen during ambulation likely upon d/c.   Shoulder Instructions     Exercises     PT Diagnosis:    PT Problem List:   PT Treatment Interventions:     PT Goals    Visit Information  Last PT Received  On: 07/03/12 Assistance Needed: +1    Subjective Data  Subjective: Yes ma'am   Prior Functioning  Home Living Lives With: Alone Type of Home: Apartment Home Access: Level entry Home Layout: One level Home Adaptive Equipment: None Prior Function Level of Independence: Independent Communication Communication: No difficulties    Cognition  Overall Cognitive Status: Appears within functional limits for tasks assessed/performed Arousal/Alertness: Awake/alert Orientation Level: Appears intact for tasks assessed Behavior During Session: [facility] for tasks performed    Extremity/Trunk Assessment Right Lower Extremity Assessment RLE ROM/Strength/Tone: [facility] for tasks assessed Left Lower Extremity Assessment LLE ROM/Strength/Tone: [facility] for tasks assessed   Balance    End of Session PT - End of Session Equipment Utilized During Treatment: Oxygen Activity Tolerance: Patient tolerated treatment well Patient left: in chair;with call bell/phone within reach  GP     [name],[name] 07/03/2012, 9:25 AM Pager: [phone]

## 2012-07-03 NOTE — Progress Notes (Signed)
  Echocardiogram 2D Echocardiogram has been performed.  Adaria Hole 07/03/2012, 11:07 AM

## 2012-07-03 NOTE — Progress Notes (Addendum)
[facility] PROGRESS NOTE  Phinneas Shakoor ZOX:[id number] DOB: 1930-07-07 DOA: 07/02/2012 PCP: Lewayne Bunting, MD  Assessment/Plan: 1. Acute on Chronic diastolic HF: probably from medication non compliance  - telemetry, follow ECHO results, cont IV lasix 20 mg BID, CIE ng, daily weights and I/O, CXR and proBNP. ACEI will be added.   2. COPD exacerbation:  - IV solumedrol will taper - nebs, spiriva, oxygen and levaquin will be changed to doxy - SSI added  3. Hypertension:  - better controlled.   4. DVT prophylaxis: sq lovenox.   5. Leukocytosis: patient is afebrile, no infiltrate on CXR. WILL get UA.   Code Status: full code  Family Communication: none at bedside  Disposition Plan: in 2 to 3 days.   Subjective : patient has done well since admission. PT/OT and nutrition eval reviewed. SOB better. No CP noted. No other symptoms.    Objective: Filed Vitals:   07/02/12 1516 07/02/12 1828 07/02/12 2037 07/03/12 0616  BP: 175/85 140/82 157/73 113/58  Pulse: 102 100 87 74  Temp: 98.4 F (36.9 C) 98.7 F (37.1 C) 99 F (37.2 C) 98.3 F (36.8 C)  TempSrc: Oral Oral Oral Oral  Resp: 26 22 26 18   Height:   6\' 1"  (1.854 m)   Weight:   56.6 kg (124 lb 12.5 oz)   SpO2: 92% 95% 95% 99%    Intake/Output Summary (Last 24 hours) at 07/03/12 1710 Last data filed at 07/03/12 1610  Gross per 24 hour  Intake    360 ml  Output   1200 ml  Net   -840 ml   Filed Weights   07/02/12 2037  Weight: 56.6 kg (124 lb 12.5 oz)    Exam: Gen : A, Ox 3, NAD, cooperative.  Oral : OMM, no thrush CVS : RRR, no m/r/g RS : CTAB, wheezes present Abd : NT, ND, BS+ Ext : no c/c/e   Data Reviewed: Basic Metabolic Panel:  Lab 07/03/12 8119 07/02/12 1550  NA 142 143  K 3.3* 3.6  CL 98 98  CO2 34* 30  GLUCOSE 217* 143*  BUN 10 7  CREATININE 0.94 0.90  CALCIUM 9.2 9.5  MG -- --  PHOS -- --   Liver Function Tests: No results found for this basename:  AST:5,ALT:5,ALKPHOS:5,BILITOT:5,PROT:5,ALBUMIN:5 in the last 168 hours No results found for this basename: LIPASE:5,AMYLASE:5 in the last 168 hours No results found for this basename: AMMONIA:5 in the last 168 hours CBC:  Lab 07/02/12 1550  WBC 12.5*  NEUTROABS --  HGB 13.0  HCT 39.8  MCV 83.1  PLT 113*   Cardiac Enzymes:  Lab 07/03/12 0654 07/03/12 0140 07/02/12 1922  CKTOTAL -- -- --  CKMB -- -- --  CKMBINDEX -- -- --  TROPONINI <0.30 <0.30 <0.30   BNP (last 3 results)  Basename 07/02/12 1550  PROBNP 5035.0*   CBG: No results found for this basename: GLUCAP:5 in the last 168 hours  No results found for this or any previous visit (from the past 240 hour(s)).   Studies: Dg Chest Port 1 View  07/02/2012  *RADIOLOGY REPORT*  Clinical Data: Shortness of breath  PORTABLE CHEST - 1 VIEW  Comparison: 02/11/2011  Findings: The cardiac shadow is stable.  A pacing device is again seen.  Interstitial changes are noted without focal infiltrate likely of a chronic nature.  No sizable effusion is seen.  IMPRESSION: Chronic interstitial changes without acute infiltrate.   Original Report Authenticated By: Alcide Clever, M.D.  Scheduled Meds:   . enoxaparin  40 mg Subcutaneous Q24H  . feeding supplement  237 mL Oral TID WC  . furosemide  20 mg Intravenous Q12H  . levofloxacin  500 mg Oral Daily  . methylPREDNISolone (SOLU-MEDROL) injection  60 mg Intravenous Q12H  . nitroGLYCERIN  1 inch Topical Q6H  . sodium chloride  3 mL Intravenous Q12H  . tiotropium  18 mcg Inhalation Daily   Continuous Infusions:   Active Problems:  COPD  Hypertension  Pacemaker  Pulmonary HTN  Acute on chronic diastolic congestive heart failure  Dyspnea on exertion    Time spent: 35 minutes   Dashea Mcmullan V.  [facility] Pager [phone]. If 8PM-8AM, please contact night-coverage at [email or web address], password [facility] 07/03/2012, 5:10 PM  LOS: 1 day

## 2012-07-03 NOTE — Progress Notes (Signed)
Inpatient Diabetes Program Recommendations  AACE/ADA: New Consensus Statement on Inpatient Glycemic Control (2013)  Target Ranges:  Prepandial:   less than 140 mg/dL      Peak postprandial:   less than 180 mg/dL (1-2 hours)      Critically ill patients:  140 - 180 mg/dL   Reason for Visit: Results for [name], [name] (MRN [id number]) as of 07/03/2012 13:06  Ref. Range 07/02/2012 15:50 07/02/2012 17:02 07/02/2012 19:22 07/03/2012 01:40  Glucose Latest Range: 70-99 mg/dL 829 (H)   562 (H)   Note lab glucose elevated greater than 200 mg/dL.  Patient has no history of diabetes, however he is on IV Solumedrol.  Please consider checking CBG's tid with meals and HS and add Novolog correction.

## 2012-07-04 DIAGNOSIS — R0789 Other chest pain: Secondary | ICD-10-CM

## 2012-07-04 LAB — GLUCOSE, CAPILLARY
Glucose-Capillary: 106 mg/dL — ABNORMAL HIGH (ref 70–99)
Glucose-Capillary: 111 mg/dL — ABNORMAL HIGH (ref 70–99)
Glucose-Capillary: 101 mg/dL — ABNORMAL HIGH (ref 70–99)

## 2012-07-04 LAB — BASIC METABOLIC PANEL
Calcium: 9.3 mg/dL (ref 8.4–10.5)
GFR calc non Af Amer: 64 mL/min — ABNORMAL LOW (ref >90)
Glucose, Bld: 115 mg/dL — ABNORMAL HIGH (ref 70–99)
Potassium: 3.8 mEq/L (ref 3.5–5.1)
Sodium: 137 mEq/L (ref 135–145)

## 2012-07-04 MED ORDER — LISINOPRIL 5 MG PO TABS: 5.0000 mg | ORAL_TABLET | Freq: Every day | ORAL | Status: DC

## 2012-07-04 MED ORDER — FUROSEMIDE 20 MG PO TABS: 10.0000 mg | ORAL_TABLET | Freq: Every day | ORAL | Status: DC

## 2012-07-04 MED ORDER — ALBUTEROL SULFATE HFA 108 (90 BASE) MCG/ACT IN AERS: 2.0000 | INHALATION_SPRAY | Freq: Four times a day (QID) | RESPIRATORY_TRACT | Status: DC | PRN

## 2012-07-04 MED ORDER — DOXYCYCLINE HYCLATE 100 MG PO TABS: 100.0000 mg | ORAL_TABLET | Freq: Two times a day (BID) | ORAL | Status: DC

## 2012-07-04 NOTE — Progress Notes (Signed)
07/04/12 1645 In to speak with pt. about [facility] services and he chose [facility].  Faxed HH orders, facesheet, discharge summary to [facility] [phone]).  Pt. to dc home today.   Tera Mater, RN, BSN

## 2012-07-04 NOTE — Progress Notes (Addendum)
Paged CM regarding discharge arrangements.  Patient lives home alone and is requesting a taxi home.

## 2012-07-04 NOTE — Progress Notes (Signed)
Patient is on a regular diet.  Order to receive insulin with meals.

## 2012-07-04 NOTE — Discharge Summary (Addendum)
Physician Discharge Summary  [name] MWU:[id number] DOB: [birth year] DOA: 07/02/2012  PCP: Jacob Bunting, MD  Admit date: 07/02/2012 Discharge date: 07/04/2012  Recommendations for Outpatient Follow-up:  1. PCP f/u in 1 to 2 weeks.   Discharge Diagnoses:  Active Problems:  COPD  Hypertension  Pacemaker  Pulmonary HTN  Acute on chronic diastolic congestive heart failure  Dyspnea on exertion Severe Prot calorie malnutrition:   Discharge Condition: stable  Diet recommendation: cardiac  Filed Weights   07/02/12 2037 07/04/12 0523  Weight: 56.6 kg (124 lb 12.5 oz) 57.516 kg (126 lb 12.8 oz)    History of present illness:  1. Acute on Chronic diastolic HF: probably from medication non compliance. He was placed on telemetry, ECHO showed EF 55-60%. We will continue lasix 20 mg po daily. Add ACEI on discharge.  2. COPD exacerbation: He received IV solumedrol with taper. He was d/c on albuterol inhaler and oral doxycycline x 3 days.  3. Hypertension:  - better controlled and did well 4. Leukocytosis: patient is afebrile, no infiltrate on CXR. Resolved during stay.  F/u as above.  5. Severe Prot calorie malnutrition: Pt evaluated by nutrition in context of acute severe illness.  He showed severe muscle wasting & fat loss in his clavicles & upper extremities.  Hospital Course:  [name] is a 78 y.o. male with multiple medical problems including Diastolic CHF, COPD, hypertension, pacemaker for symptomatic bradycardia, came infor DOE, was found to be wheezing on exam on arrival to ED. He was given IV solumedrol, and nebs and his breathing improved. He denies anyc hest pain , palpitations, . He reports he felt dizziness, and had one episode of vomiting. He also reports that he has been out of medications for many months now. He is being admitted to hospitalist service for evaluation and management of chf and copd  exacerbation.  Procedures: Echo  Consultations:  none  Discharge Exam: Filed Vitals:   07/04/12 0523 07/04/12 0845 07/04/12 1000 07/04/12 1231  BP: 133/83  108/61 130/76  Pulse: 75     Temp: 98.2 F (36.8 C)  97.6 F (36.4 C)   TempSrc: Oral  Tympanic   Resp: 18  20   Height:      Weight: 57.516 kg (126 lb 12.8 oz)     SpO2:  92% 97%     General: A, O x 4, NAD Cardiovascular: s1s2 reg, no m/r/g Respiratory: GBAE, CTAB  Discharge Instructions     Medication List     As of 07/04/2012  1:28 PM    TAKE these medications         albuterol 108 (90 BASE) MCG/ACT inhaler   Commonly known as: PROVENTIL HFA;VENTOLIN HFA   Inhale 2 puffs into the lungs every 6 (six) hours as needed for wheezing.      CALTRATE 600+D 600-400 MG-UNIT per tablet   Generic drug: Calcium Carbonate-Vitamin D   Take 1 tablet by mouth daily.      doxycycline 100 MG tablet   Commonly known as: VIBRA-TABS   Take 1 tablet (100 mg total) by mouth every 12 (twelve) hours.           Follow-up Information    Follow up with Jacob Bunting, MD. Schedule an appointment as soon as possible for a visit in 2 weeks.   Contact information:   [address] [place] [address] [phone]           The results of significant diagnostics  from this hospitalization (including imaging, microbiology, ancillary and laboratory) are listed below for reference.    Significant Diagnostic Studies: Dg Chest Port 1 View  07/02/2012  *RADIOLOGY REPORT*  Clinical Data: Shortness of breath  PORTABLE CHEST - 1 VIEW  Comparison: 02/11/2011  Findings: The cardiac shadow is stable.  A pacing device is again seen.  Interstitial changes are noted without focal infiltrate likely of a chronic nature.  No sizable effusion is seen.  IMPRESSION: Chronic interstitial changes without acute infiltrate.   Original Report Authenticated By: Alcide Clever, M.D.     Microbiology: No results found for this or any  previous visit (from the past 240 hour(s)).   Labs: Basic Metabolic Panel:  Lab 07/04/12 1610 07/03/12 0140 07/02/12 1550  NA 137 142 143  K 3.8 3.3* 3.6  CL 96 98 98  CO2 34* 34* 30  GLUCOSE 115* 217* 143*  BUN 27* 10 7  CREATININE 1.07 0.94 0.90  CALCIUM 9.3 9.2 9.5  MG -- -- --  PHOS -- -- --   Liver Function Tests: No results found for this basename: AST:5,ALT:5,ALKPHOS:5,BILITOT:5,PROT:5,ALBUMIN:5 in the last 168 hours No results found for this basename: LIPASE:5,AMYLASE:5 in the last 168 hours No results found for this basename: AMMONIA:5 in the last 168 hours CBC:  Lab 07/02/12 1550  WBC 12.5*  NEUTROABS --  HGB 13.0  HCT 39.8  MCV 83.1  PLT 113*   Cardiac Enzymes:  Lab 07/03/12 0654 07/03/12 0140 07/02/12 1922  CKTOTAL -- -- --  CKMB -- -- --  CKMBINDEX -- -- --  TROPONINI <0.30 <0.30 <0.30   BNP: BNP (last 3 results)  Basename 07/02/12 1550  PROBNP 5035.0*   CBG:  Lab 07/04/12 1243 07/04/12 0757  GLUCAP 111* 106*    Time coordinating discharge: 45 minutes Signed:  PATEL,TARAK V.  [facility] 07/04/2012, 1:28 PM

## 2012-07-23 ENCOUNTER — Ambulatory Visit (INDEPENDENT_AMBULATORY_CARE_PROVIDER_SITE_OTHER): Payer: Medicare Other | Admitting: Internal Medicine

## 2012-07-23 ENCOUNTER — Encounter: Payer: Self-pay | Admitting: Internal Medicine

## 2012-07-23 VITALS — BP 164/90 | HR 90 | Wt 134.0 lb

## 2012-07-23 DIAGNOSIS — I10 Essential (primary) hypertension: Secondary | ICD-10-CM

## 2012-07-23 DIAGNOSIS — I509 Heart failure, unspecified: Secondary | ICD-10-CM

## 2012-07-23 DIAGNOSIS — Z950 Presence of cardiac pacemaker: Secondary | ICD-10-CM

## 2012-07-23 DIAGNOSIS — I5033 Acute on chronic diastolic (congestive) heart failure: Secondary | ICD-10-CM

## 2012-07-23 MED ORDER — CARVEDILOL 3.125 MG PO TABS: 3.1250 mg | ORAL_TABLET | Freq: Two times a day (BID) | ORAL | Status: DC

## 2012-07-23 NOTE — Patient Instructions (Addendum)
Your physician wants you to follow-up in: 6 months with Dr Court Joy will receive a reminder letter in the mail two months in advance. If you don't receive a letter, please call our office to schedule the follow-up appointment.  Your physician has recommended you make the following change in your medication:  1) Start Carvedilol 3.125mg  twice daily

## 2012-07-23 NOTE — Progress Notes (Signed)
HPI [name] returns today for followup. He is a very pleasant 79 year old man with hypertension, symptomatic bradycardia, and chronic diastolic heart failure. He also has COPD and a long history of tobacco abuse which he currently denies. The patient was hospitalized several weeks ago with acute on chronic diastolic heart failure. Prior to hospitalization, he admitted to dietary indiscretion and medical noncompliance. Since then he has done well. He notes that he is weighing himself. He is trying to avoid foods high in salt. He denies syncope. No Known Allergies   Current Outpatient Prescriptions  Medication Sig Dispense Refill  . albuterol (PROVENTIL HFA;VENTOLIN HFA) 108 (90 BASE) MCG/ACT inhaler Inhale 2 puffs into the lungs every 6 (six) hours as needed for wheezing.  1 Inhaler  2  . Calcium Carbonate-Vitamin D (CALTRATE 600+D) 600-400 MG-UNIT per tablet Take 1 tablet by mouth daily.       . furosemide (LASIX) 20 MG tablet Take 0.5 tablets (10 mg total) by mouth daily.  30 tablet  1  . lisinopril (PRINIVIL,ZESTRIL) 5 MG tablet Take 1 tablet (5 mg total) by mouth daily.  30 tablet  1  . carvedilol (COREG) 3.125 MG tablet Take 1 tablet (3.125 mg total) by mouth 2 (two) times daily.  180 tablet  3     Past Medical History  Diagnosis Date  . Symptomatic bradycardia     and complete heart block - s/p dual chamber Medtronic pacemaker insertion (model [id number]) - followed by [facility]  . Hypertension   . Tricuspid regurgitation     mild/moderate  . COPD (chronic obstructive pulmonary disease)   . Prostate cancer 2009    followed by Dr. Dalene Carrow, pt reports he is s/p radiation/ chemotherapy (pending records)  . Aortic valve calcification   . Pulmonary HTN      PA pressure stable echo, June, 2012, 49 mmHg  moderate - with PA peak pressure 49 mmHg (per 2-D echo 12/2010)  . Rectal cancer 04/2008    rectal mass biopsy showing invasive, poorly differentiated squamous cell carcinoma  .  Diastolic dysfunction     grade 1 diastolic dysfunction per 2-D echo (12/2010) , normal LV systolic function with EF 55-60%  . Pacemaker     Bradycardia and AV block, Dr. Ladona Ridgel  . Ejection fraction     Normal, echo, 2009, 57%, nuclear, 2009, / EF 55-60%, echo, June, 2012  . Chest discomfort     Nuclear, 2009, no ischemia    ROS:   All systems reviewed and negative except as noted in the HPI.   Past Surgical History  Procedure Date  . Appendectomy 1957  . Pacemaker insertion 09/2007    Medtronic dual-chamber pacemaker (model [id number]) in setting of sympomatic bradycardia and complete heart block. By Dr. Ladona Ridgel ([facility])     No family history on file.   History   Social History  . Marital Status: Legally Separated    Spouse Name: N/A    Number of Children: 4  . Years of Education: 11th grade   Occupational History  . 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   Social History Main Topics  . Smoking status: Current Every Day Smoker -- 64 years    Types: Pipe  . Smokeless tobacco: Never Used     Comment: May smoke once or twice a week from his pipe.   . Alcohol Use: No  . Drug Use: No  . Sexually Active: Not on file  Other Topics Concern  . Not on file   Social History Narrative   Lives alone, widowed, [employer], kids live out of state, but he has a niece who is local and with whom he is close.      BP 164/90  Pulse 90  Wt 134 lb (60.782 kg)  Physical Exam:  Well appearing but dyskempt elderly man, NAD HEENT: Unremarkable Neck:  8 cm JVD, no thyromegally Lungs:  Clear except for rales in the bases bilaterally. No increased work of breathing. No wheezes, and no rhonchi. HEART:  Regular rate rhythm, no murmurs, no rubs, no clicks Abd:  soft, positive bowel sounds, no organomegally, no rebound, no guarding Ext:  2 plus pulses, no edema, no cyanosis, no clubbing Skin:  No rashes no nodules Neuro:  CN II through XII intact,  motor grossly intact  Assess/Plan:

## 2012-07-23 NOTE — Assessment & Plan Note (Signed)
His pacemaker was not interrogated today but will be in 6 months when he returns for followup. Previously, it was working normally.

## 2012-07-23 NOTE — Assessment & Plan Note (Signed)
His blood pressure remains uncontrolled. I've asked the patient to start taking a low dose beta blocker. Also, the importance of a low-sodium diet is noted.

## 2012-07-23 NOTE — Assessment & Plan Note (Signed)
His acute on chronic diastolic heart failure is well compensated. Today I've asked the patient to start taking carvedilol as his blood pressure is still not well-controlled. He may require up titration of his diuretic. A low-sodium diet is recommended.

## 2012-07-24 ENCOUNTER — Telehealth: Payer: Self-pay | Admitting: Internal Medicine

## 2012-07-24 NOTE — Telephone Encounter (Signed)
lmom for [facility] that seeing him two more times is great and to send me the order for Dr Ladona Ridgel to sign

## 2012-07-24 NOTE — Telephone Encounter (Signed)
[facility] calling re more orders twice a week for one more week, only seen four times, pls call [place] [phone] ok to leave message

## 2012-07-28 ENCOUNTER — Telehealth: Payer: Self-pay | Admitting: Internal Medicine

## 2012-07-28 NOTE — Telephone Encounter (Signed)
New Problem:    Called in because the patient has not gone to pick up his carvedilol (COREG) 3.125 MG tablet.  Please call back if you have any questions.

## 2012-11-10 ENCOUNTER — Telehealth: Payer: Self-pay | Admitting: Internal Medicine

## 2012-11-10 NOTE — Telephone Encounter (Signed)
Returned call to [place] at [facility].She stated she faxed order on patient and they have not received back.Message sent to Dr.Taylor's nurse.

## 2012-11-10 NOTE — Telephone Encounter (Signed)
New problem   Calling about an order that they have not received

## 2012-11-11 NOTE — Telephone Encounter (Signed)
Spoke to [name] and she will refax

## 2012-11-12 NOTE — Telephone Encounter (Signed)
Faxed in

## 2012-12-11 ENCOUNTER — Encounter: Payer: Self-pay | Admitting: Internal Medicine

## 2012-12-11 ENCOUNTER — Ambulatory Visit (INDEPENDENT_AMBULATORY_CARE_PROVIDER_SITE_OTHER): Payer: Medicare PPO | Admitting: Internal Medicine

## 2012-12-11 VITALS — BP 156/74 | HR 92 | Ht 72.0 in | Wt 134.6 lb

## 2012-12-11 DIAGNOSIS — I442 Atrioventricular block, complete: Secondary | ICD-10-CM

## 2012-12-11 DIAGNOSIS — I4891 Unspecified atrial fibrillation: Secondary | ICD-10-CM

## 2012-12-11 DIAGNOSIS — I10 Essential (primary) hypertension: Secondary | ICD-10-CM

## 2012-12-11 DIAGNOSIS — Z950 Presence of cardiac pacemaker: Secondary | ICD-10-CM

## 2012-12-11 LAB — PACEMAKER DEVICE OBSERVATION
AL AMPLITUDE: 0.7 mv
AL IMPEDENCE PM: 437 Ohm
BATTERY VOLTAGE: 2.78 V
RV LEAD AMPLITUDE: 11.2 mv
RV LEAD IMPEDENCE PM: 510 Ohm
VENTRICULAR PACING PM: 100

## 2012-12-11 MED ORDER — LOSARTAN POTASSIUM 50 MG PO TABS: 50.0000 mg | ORAL_TABLET | Freq: Every day | ORAL | Status: DC

## 2012-12-11 NOTE — Patient Instructions (Addendum)
Your physician wants you to follow-up in: 6 months with device clinic and 12 months with Dr Court Joy will receive a reminder letter in the mail two months in advance. If you don't receive a letter, please call our office to schedule the follow-up appointment.  Your physician has recommended you make the following change in your medication:  1) Stop Lisinopril 2) Start Cozaar 50 mg daily

## 2012-12-16 ENCOUNTER — Encounter: Payer: Self-pay | Admitting: Internal Medicine

## 2012-12-16 DIAGNOSIS — I4891 Unspecified atrial fibrillation: Secondary | ICD-10-CM | POA: Insufficient documentation

## 2012-12-16 NOTE — Assessment & Plan Note (Signed)
His Medtronic DDD PPM is working normally. Will recheck in several months. 

## 2012-12-16 NOTE — Assessment & Plan Note (Signed)
With cough, we will switch his ACE inhibitor to an ARB.

## 2012-12-16 NOTE — Progress Notes (Signed)
HPI Mr. Sandlin returns today for followup. He is a pleasant 79 yo man with a h/o CHB, s/p PPM insertion. He also has HTN and COPD. He has pulmonary HTN. No chest pain or sob. Minimal peripheral edema. He does have a cough, raising the question of cough from his ACE inhibitor. No Known Allergies   Current Outpatient Prescriptions  Medication Sig Dispense Refill  . albuterol (PROVENTIL HFA;VENTOLIN HFA) 108 (90 BASE) MCG/ACT inhaler Inhale 2 puffs into the lungs every 6 (six) hours as needed for wheezing.  1 Inhaler  2  . Calcium Carbonate-Vitamin D (CALTRATE 600+D) 600-400 MG-UNIT per tablet Take 1 tablet by mouth daily.       . carvedilol (COREG) 3.125 MG tablet Take 1 tablet (3.125 mg total) by mouth 2 (two) times daily.  180 tablet  3  . furosemide (LASIX) 20 MG tablet Take 0.5 tablets (10 mg total) by mouth daily.  30 tablet  1  . losartan (COZAAR) 50 MG tablet Take 1 tablet (50 mg total) by mouth daily.  90 tablet  3   No current facility-administered medications for this visit.     Past Medical History  Diagnosis Date  . Symptomatic bradycardia     and complete heart block - s/p dual chamber Medtronic pacemaker insertion (model [id number]) - followed by [facility]  . Hypertension   . Tricuspid regurgitation     mild/moderate  . COPD (chronic obstructive pulmonary disease)   . Prostate cancer 2009    followed by Dr. Dalene Carrow, pt reports he is s/p radiation/ chemotherapy (pending records)  . Aortic valve calcification   . Pulmonary HTN      PA pressure stable echo, June, 2012, 49 mmHg  moderate - with PA peak pressure 49 mmHg (per 2-D echo 12/2010)  . Rectal cancer 04/2008    rectal mass biopsy showing invasive, poorly differentiated squamous cell carcinoma  . Diastolic dysfunction     grade 1 diastolic dysfunction per 2-D echo (12/2010) , normal LV systolic function with EF 55-60%  . Pacemaker     Bradycardia and AV block, Dr. Ladona Ridgel  . Ejection fraction     Normal, echo, 2009,  57%, nuclear, 2009, / EF 55-60%, echo, June, 2012  . Chest discomfort     Nuclear, 2009, no ischemia    ROS:   All systems reviewed and negative except as noted in the HPI.   Past Surgical History  Procedure Laterality Date  . Appendectomy  1957  . Pacemaker insertion  09/2007    Medtronic dual-chamber pacemaker (model [id number]) in setting of sympomatic bradycardia and complete heart block. By Dr. Ladona Ridgel ([facility])     No family history on file.   History   Social History  . Marital Status: Legally Separated    Spouse Name: N/A    Number of Children: 4  . Years of Education: 11th grade   Occupational History  . 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   Social History Main Topics  . Smoking status: Current Every Day Smoker -- 64 years    Types: Pipe  . Smokeless tobacco: Never Used     Comment: May smoke once or twice a week from his pipe.   . Alcohol Use: No  . Drug Use: No  . Sexually Active: Not on file   Other Topics Concern  . Not on file   Social History Narrative   Lives alone, widowed, [employer], kids live  out of state, but he has a niece who is local and with whom he is close.      BP 156/74  Pulse 92  Ht 6' (1.829 m)  Wt 134 lb 9.6 oz (61.054 kg)  BMI 18.25 kg/m2  Physical Exam:  Well appearing 79 yo woman, NAD HEENT: Unremarkable Neck:  7 cm JVD, no thyromegally Back:  No CVA tenderness Lungs:  Clear with no wheezes, rales, or rhonchi HEART:  Regular rate rhythm, no murmurs, no rubs, no clicks Abd:  soft, positive bowel sounds, no organomegally, no rebound, no guarding Ext:  2 plus pulses, no edema, no cyanosis, no clubbing Skin:  No rashes no nodules Neuro:  CN II through XII intact, motor grossly intact  DEVICE  Normal device function.  See PaceArt for details.   Assess/Plan:

## 2012-12-16 NOTE — Assessment & Plan Note (Signed)
His atrial fibrillation is well controlled in the setting of underlying CHB. He is in NSR approx 83% of the time and is on therapeutic anti-coagulation.

## 2013-06-11 ENCOUNTER — Encounter: Payer: Self-pay | Admitting: Internal Medicine

## 2013-08-06 ENCOUNTER — Other Ambulatory Visit: Payer: Self-pay

## 2013-08-06 DIAGNOSIS — I10 Essential (primary) hypertension: Secondary | ICD-10-CM

## 2013-08-06 MED ORDER — CARVEDILOL 3.125 MG PO TABS: 3.1250 mg | ORAL_TABLET | Freq: Two times a day (BID) | ORAL | Status: AC

## 2013-10-13 ENCOUNTER — Ambulatory Visit (INDEPENDENT_AMBULATORY_CARE_PROVIDER_SITE_OTHER): Payer: Medicare PPO | Admitting: *Deleted

## 2013-10-13 ENCOUNTER — Encounter: Payer: Self-pay | Admitting: Internal Medicine

## 2013-10-13 DIAGNOSIS — I442 Atrioventricular block, complete: Secondary | ICD-10-CM

## 2013-10-13 LAB — MDC_IDC_ENUM_SESS_TYPE_INCLINIC
Battery Remaining Longevity: 51 mo
Battery Voltage: 2.77 V
Brady Statistic AP VP Percent: 21 %
Brady Statistic AS VP Percent: 79 %
Date Time Interrogation Session: 20150325155114
Lead Channel Impedance Value: 425 Ohm
Lead Channel Pacing Threshold Amplitude: 0.75 V
Lead Channel Pacing Threshold Amplitude: 0.75 V
Lead Channel Sensing Intrinsic Amplitude: 2 mV
Lead Channel Setting Pacing Amplitude: 2 V
Lead Channel Setting Sensing Sensitivity: 2.8 mV
MDC IDC MSMT BATTERY IMPEDANCE: 992 Ohm
MDC IDC MSMT LEADCHNL RA PACING THRESHOLD PULSEWIDTH: 0.4 ms
MDC IDC MSMT LEADCHNL RV IMPEDANCE VALUE: 468 Ohm
MDC IDC MSMT LEADCHNL RV PACING THRESHOLD PULSEWIDTH: 0.4 ms
MDC IDC MSMT LEADCHNL RV SENSING INTR AMPL: 11.2 mV
MDC IDC SET LEADCHNL RV PACING AMPLITUDE: 2.5 V
MDC IDC SET LEADCHNL RV PACING PULSEWIDTH: 0.4 ms
MDC IDC STAT BRADY AP VS PERCENT: 0 %
MDC IDC STAT BRADY AS VS PERCENT: 0 %

## 2013-10-13 NOTE — Progress Notes (Signed)
Pacemaker check in clinic. Normal device function. Thresholds, sensing, impedances consistent with previous measurements. Device programmed to maximize longevity. 168 mode switches, the longest >1 minute, - coumadin.  No high ventricular rates noted. Device programmed at appropriate safety margins. Histogram distribution appropriate for patient activity level. Device programmed to optimize intrinsic conduction. Estimated longevity 4 years.  Patient education completed.  ROV in June with Dr. Lovena Le.

## 2013-11-01 ENCOUNTER — Emergency Department (HOSPITAL_COMMUNITY)
Admission: EM | Admit: 2013-11-01 | Discharge: 2013-11-01 | Disposition: A | Discharge status: Home / Self Care | Payer: Medicare PPO | Attending: Emergency Medicine | Admitting: Emergency Medicine

## 2013-11-01 ENCOUNTER — Encounter (HOSPITAL_COMMUNITY): Payer: Self-pay | Admitting: Emergency Medicine

## 2013-11-01 ENCOUNTER — Emergency Department (HOSPITAL_COMMUNITY): Payer: Medicare PPO

## 2013-11-01 DIAGNOSIS — J449 Chronic obstructive pulmonary disease, unspecified: Secondary | ICD-10-CM | POA: Insufficient documentation

## 2013-11-01 DIAGNOSIS — J4489 Other specified chronic obstructive pulmonary disease: Secondary | ICD-10-CM | POA: Insufficient documentation

## 2013-11-01 DIAGNOSIS — R221 Localized swelling, mass and lump, neck: Secondary | ICD-10-CM

## 2013-11-01 DIAGNOSIS — Z85048 Personal history of other malignant neoplasm of rectum, rectosigmoid junction, and anus: Secondary | ICD-10-CM | POA: Insufficient documentation

## 2013-11-01 DIAGNOSIS — Z79899 Other long term (current) drug therapy: Secondary | ICD-10-CM | POA: Insufficient documentation

## 2013-11-01 DIAGNOSIS — K089 Disorder of teeth and supporting structures, unspecified: Secondary | ICD-10-CM | POA: Insufficient documentation

## 2013-11-01 DIAGNOSIS — R220 Localized swelling, mass and lump, head: Secondary | ICD-10-CM | POA: Insufficient documentation

## 2013-11-01 DIAGNOSIS — I2789 Other specified pulmonary heart diseases: Secondary | ICD-10-CM | POA: Insufficient documentation

## 2013-11-01 DIAGNOSIS — F17200 Nicotine dependence, unspecified, uncomplicated: Secondary | ICD-10-CM | POA: Insufficient documentation

## 2013-11-01 DIAGNOSIS — Z8546 Personal history of malignant neoplasm of prostate: Secondary | ICD-10-CM | POA: Insufficient documentation

## 2013-11-01 DIAGNOSIS — Z8669 Personal history of other diseases of the nervous system and sense organs: Secondary | ICD-10-CM | POA: Insufficient documentation

## 2013-11-01 DIAGNOSIS — I10 Essential (primary) hypertension: Secondary | ICD-10-CM | POA: Insufficient documentation

## 2013-11-01 DIAGNOSIS — Z950 Presence of cardiac pacemaker: Secondary | ICD-10-CM | POA: Insufficient documentation

## 2013-11-01 LAB — CBC WITH DIFFERENTIAL/PLATELET
BASOS ABS: 0 10*3/uL (ref 0.0–0.1)
Basophils Relative: 0 % (ref 0–1)
Eosinophils Absolute: 0.1 10*3/uL (ref 0.0–0.7)
Eosinophils Relative: 2 % (ref 0–5)
HCT: 40.2 % (ref 39.0–52.0)
Hemoglobin: 13.1 g/dL (ref 13.0–17.0)
LYMPHS PCT: 10 % — AB (ref 12–46)
Lymphs Abs: 0.8 10*3/uL (ref 0.7–4.0)
MCH: 26.9 pg (ref 26.0–34.0)
MCHC: 32.6 g/dL (ref 30.0–36.0)
MCV: 82.5 fL (ref 78.0–100.0)
Monocytes Absolute: 0.8 10*3/uL (ref 0.1–1.0)
Monocytes Relative: 10 % (ref 3–12)
NEUTROS ABS: 6.5 10*3/uL (ref 1.7–7.7)
Neutrophils Relative %: 79 % — ABNORMAL HIGH (ref 43–77)
PLATELETS: 172 10*3/uL (ref 150–400)
RBC: 4.87 MIL/uL (ref 4.22–5.81)
RDW: 15.2 % (ref 11.5–15.5)
WBC: 8.2 10*3/uL (ref 4.0–10.5)

## 2013-11-01 LAB — BASIC METABOLIC PANEL
BUN: 7 mg/dL (ref 6–23)
CHLORIDE: 101 meq/L (ref 96–112)
CO2: 32 meq/L (ref 19–32)
Calcium: 9.7 mg/dL (ref 8.4–10.5)
Creatinine, Ser: 0.77 mg/dL (ref 0.50–1.35)
GFR calc Af Amer: >90 mL/min (ref >90)
GFR calc non Af Amer: 83 mL/min — ABNORMAL LOW (ref >90)
GLUCOSE: 85 mg/dL (ref 70–99)
POTASSIUM: 3.7 meq/L (ref 3.7–5.3)
SODIUM: 146 meq/L (ref 137–147)

## 2013-11-01 LAB — I-STAT CHEM 8, ED
BUN: 5 mg/dL — AB (ref 6–23)
CHLORIDE: 100 meq/L (ref 96–112)
CREATININE: 0.9 mg/dL (ref 0.50–1.35)
Calcium, Ion: 1.13 mmol/L (ref 1.13–1.30)
GLUCOSE: 89 mg/dL (ref 70–99)
HCT: 45 % (ref 39.0–52.0)
Hemoglobin: 15.3 g/dL (ref 13.0–17.0)
POTASSIUM: 3.5 meq/L — AB (ref 3.7–5.3)
SODIUM: 145 meq/L (ref 137–147)
TCO2: 35 mmol/L (ref 0–100)

## 2013-11-01 MED ORDER — IOHEXOL 300 MG/ML  SOLN
75.0000 mL | Freq: Once | INTRAMUSCULAR | Status: AC | PRN
  Administered 2013-11-01: 75 mL via INTRAVENOUS

## 2013-11-01 NOTE — ED Provider Notes (Signed)
5:59 PM Discussed patient with Dr. Benjamine Mola. Given patient's age and smoking history this is most concerning for cancer. Given no fever or obvious signs of infection he recommends no antibiotics, can seen in clinic tomorrow or next day. Discussed this with patient who agrees. Has good voice, no drooling and normal airway. Discussed return precautions.  Ephraim Hamburger, MD 11/01/13 1800

## 2013-11-01 NOTE — ED Notes (Signed)
Per pt sts sore throat, ear pain, dental pain and HA.

## 2013-11-01 NOTE — ED Provider Notes (Signed)
CSN: [id number]     Arrival date & time 11/01/13  1324 History   First MD Initiated Contact with Patient 11/01/13 1414     Chief Complaint  Patient presents with  . Sore Throat  . Dental Pain  . Headache     (Consider location/radiation/quality/duration/timing/severity/associated sxs/prior Treatment) HPI Comments: Patient presents to the ER for evaluation of pain in the left side of his throat, face and ear. Symptoms present for several days. Pain goes up into the head region. He has not had any hearing loss. He has not noticed any specific teeth that are sore or swollen. Pain is on the left side of the throat and worsens when he swallows. He feels a lump in the area. No difficulty breathing.  Patient is a 80 y.o. male presenting with pharyngitis, tooth pain, and headaches.  Sore Throat Associated symptoms include headaches. Pertinent negatives include no shortness of breath.  Dental Pain Associated symptoms: headaches   Headache Associated symptoms: ear pain and sore throat     Past Medical History  Diagnosis Date  . Symptomatic bradycardia     and complete heart block - s/p dual chamber Medtronic pacemaker insertion (model [id number]) - followed by [facility]  . Hypertension   . Tricuspid regurgitation     mild/moderate  . COPD (chronic obstructive pulmonary disease)   . Prostate cancer 2009    followed by Dr. Jamse Arn, pt reports he is s/p radiation/ chemotherapy (pending records)  . Aortic valve calcification   . Pulmonary HTN      PA pressure stable echo, June, 2012, 49 mmHg  moderate - with PA peak pressure 49 mmHg (per 2-D echo 12/2010)  . Rectal cancer 04/2008    rectal mass biopsy showing invasive, poorly differentiated squamous cell carcinoma  . Diastolic dysfunction     grade 1 diastolic dysfunction per 2-D echo (12/2010) , normal LV systolic function with EF 55-60%  . Pacemaker     Bradycardia and AV block, Dr. Lovena Le  . Ejection fraction     Normal, echo, 2009, 57%,  nuclear, 2009, / EF 55-60%, echo, June, 2012  . Chest discomfort     Nuclear, 2009, no ischemia   Past Surgical History  Procedure Laterality Date  . Appendectomy  1957  . Pacemaker insertion  09/2007    Medtronic dual-chamber pacemaker (model [id number]) in setting of sympomatic bradycardia and complete heart block. By Dr. Lovena Le [facility])   History reviewed. No pertinent family history. History  Substance Use Topics  . Smoking status: Current Every Day Smoker -- 64 years    Types: Pipe  . Smokeless tobacco: Never Used     Comment: May smoke once or twice a week from his pipe.   . Alcohol Use: No    Review of Systems  HENT: Positive for ear pain and sore throat. Negative for voice change.   Respiratory: Negative for shortness of breath.   Neurological: Positive for headaches.  All other systems reviewed and are negative.     Allergies  Review of patient's allergies indicates no known allergies.  Home Medications   Current Outpatient Rx  Name  Route  Sig  Dispense  Refill  . Calcium Carbonate-Vitamin D (CALTRATE 600+D) 600-400 MG-UNIT per tablet   Oral   Take 1 tablet by mouth daily.          . carvedilol (COREG) 3.125 MG tablet   Oral   Take 1 tablet (3.125 mg total) by mouth 2 (two) times daily.  180 tablet   3   . losartan (COZAAR) 50 MG tablet   Oral   Take 1 tablet (50 mg total) by mouth daily.   90 tablet   3    BP 154/78  Pulse 59  Temp(Src) 98 F (36.7 C) (Oral)  Resp 18  Wt 112 lb 9.6 oz (51.075 kg)  SpO2 98% Physical Exam  Constitutional: He is oriented to person, place, and time. He appears well-developed and well-nourished. No distress.  HENT:  Head: Normocephalic and atraumatic.    Right Ear: Hearing normal.  Left Ear: Hearing, tympanic membrane, external ear and ear canal normal.  Nose: Nose normal.  Mouth/Throat: Oropharynx is clear and moist and mucous membranes are normal.    Eyes: Conjunctivae and EOM are normal. Pupils are  equal, round, and reactive to light.  Neck: Normal range of motion. Neck supple.  Cardiovascular: Regular rhythm, S1 normal and S2 normal.  Exam reveals no gallop and no friction rub.   No murmur heard. Pulmonary/Chest: Effort normal and breath sounds normal. No respiratory distress. He exhibits no tenderness.  Abdominal: Soft. Normal appearance and bowel sounds are normal. There is no hepatosplenomegaly. There is no tenderness. There is no rebound, no guarding, no tenderness at McBurney's point and negative Murphy's sign. No hernia.  Musculoskeletal: Normal range of motion.  Neurological: He is alert and oriented to person, place, and time. He has normal strength. No cranial nerve deficit or sensory deficit. Coordination normal. GCS eye subscore is 4. GCS verbal subscore is 5. GCS motor subscore is 6.  Skin: Skin is warm, dry and intact. No rash noted. No cyanosis.  Psychiatric: He has a normal mood and affect. His speech is normal and behavior is normal. Thought content normal.    ED Course  Procedures (including critical care time) Labs Review Labs Reviewed  CBC WITH DIFFERENTIAL - Abnormal; Notable for the following:    Neutrophils Relative % 79 (*)    Lymphocytes Relative 10 (*)    All other components within normal limits  BASIC METABOLIC PANEL - Abnormal; Notable for the following:    GFR calc non Af Amer 83 (*)    All other components within normal limits  I-STAT CHEM 8, ED - Abnormal; Notable for the following:    Potassium 3.5 (*)    BUN 5 (*)    All other components within normal limits   Imaging Review Ct Soft Tissue Neck W Contrast  11/01/2013   CLINICAL DATA:  Left-sided neck mass.  Pain.  EXAM: CT NECK WITH CONTRAST  TECHNIQUE: Multidetector CT imaging of the neck was performed using the standard protocol following the bolus administration of intravenous contrast.  CONTRAST:  80mL OMNIPAQUE IOHEXOL 300 MG/ML  SOLN  COMPARISON:  PET-CT 09/06/2008  FINDINGS: Contrast is  injected through the left arm. 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 Therefore, contrast refluxes through multiple collaterals  The lung apices show advanced centrilobular emphysema with areas of pleural and parenchymal scarring no identifiable mass lesion.  Visualized intracranial contents do not show any abnormality.  There is a large necrotic mass of the left oropharynx and left tongue base. This is difficult to measure precisely because of the extensive nature of the necrosis. The overall diameter of the mass is probably on the order of 4.5 cm, but much of the mass, particularly in the tongue base region, is necrotic. Tumor extends into the aryepiglottic fold on the left.  There are metastatic necrotic lymph nodes at level 2 on the left the largest measuring 2.1 x 2.1 x 2.5 cm a second necrotic node just inferior to that measures 1.6 cm in diameter. There is a level 3 node that measures 8 mm in diameter on the left. On the right, there is a level 2 node that measures 16 by 9 mm. There is a level 3 node that measures 10 mm in diameter. There are supraclavicular nodes on the right, the largest measured at 11 mm. There is an upper right paratracheal lymph node that measures 13 by 18 mm in diameter, with a 9 mm node adjacent to that. These are worrisome for metastatic involvement, though the pattern would be unusual for this neck primary. Has the chest been evaluated recently?  No masses of the parotid, submandibular or thyroid glands.  There is advanced degenerative spondylosis of the cervical spine. No evidence of osseous metastatic disease in the region.  IMPRESSION: Large necrotic mass of the left oropharynx and tongue base as described above. Overall size is approximately 4.5 cm, though much of the mass is necrotic. Centrally necrotic metastatic lymph nodes on the left in the level 2 region. Suspicious lymph nodes on the right in the level 2 region and  supraclavicular region.  Suspicious lymph nodes in the right paratracheal region. This would be an unusual metastatic pattern, but possible. Patient has advanced chronic lung disease. Has the Chest been evaluated recently?   Electronically Signed   By: Nelson Chimes M.D.   On: 11/01/2013 17:21     EKG Interpretation None      MDM   Final diagnoses:  Neck mass   Patient presents with sore throat, left ear pain, left facial pain. He does have a left submandibular mass, lymph node versus salivary gland versus mass. Examination did reveal some asymmetry of the soft palate. The left soft palate did appear to be displaced downward and possibly anteriorly, possible mass effect. Because of this, CT scan with IV contrast was ordered. Lab work is unremarkable. Airway is patent. Patient appears comfortable. CT scan soft tissue neck did reveal a large necrotic mass in the left oropharynx and tongue base. This is not causing the patient any distress, he is breathing and swallowing without difficulty. ENT will be consulted to determine urgency and course of action for treatment.   Orpah Greek, MD 11/02/13 [phone]

## 2013-12-15 ENCOUNTER — Encounter: Payer: Self-pay | Admitting: Internal Medicine

## 2014-01-13 ENCOUNTER — Encounter: Payer: Medicare PPO | Admitting: Internal Medicine

## 2014-01-18 ENCOUNTER — Emergency Department (HOSPITAL_COMMUNITY): Payer: Medicare PPO

## 2014-01-18 ENCOUNTER — Inpatient Hospital Stay (HOSPITAL_COMMUNITY)
Admission: EM | Admit: 2014-01-18 | Discharge: 2014-01-20 | DRG: 146 | Disposition: A | Discharge status: Home Health | Payer: Medicare PPO | Attending: Internal Medicine | Admitting: Internal Medicine

## 2014-01-18 ENCOUNTER — Ambulatory Visit (INDEPENDENT_AMBULATORY_CARE_PROVIDER_SITE_OTHER): Payer: Medicare PPO | Admitting: Internal Medicine

## 2014-01-18 ENCOUNTER — Encounter (HOSPITAL_COMMUNITY): Payer: Self-pay | Admitting: Emergency Medicine

## 2014-01-18 ENCOUNTER — Encounter: Payer: Self-pay | Admitting: Internal Medicine

## 2014-01-18 VITALS — BP 126/70 | HR 92 | Ht 72.0 in | Wt 110.0 lb

## 2014-01-18 DIAGNOSIS — J9610 Chronic respiratory failure, unspecified whether with hypoxia or hypercapnia: Secondary | ICD-10-CM | POA: Diagnosis present

## 2014-01-18 DIAGNOSIS — J9600 Acute respiratory failure, unspecified whether with hypoxia or hypercapnia: Secondary | ICD-10-CM | POA: Insufficient documentation

## 2014-01-18 DIAGNOSIS — I5033 Acute on chronic diastolic (congestive) heart failure: Secondary | ICD-10-CM

## 2014-01-18 DIAGNOSIS — Z923 Personal history of irradiation: Secondary | ICD-10-CM

## 2014-01-18 DIAGNOSIS — R0609 Other forms of dyspnea: Secondary | ICD-10-CM

## 2014-01-18 DIAGNOSIS — R221 Localized swelling, mass and lump, neck: Secondary | ICD-10-CM

## 2014-01-18 DIAGNOSIS — I071 Rheumatic tricuspid insufficiency: Secondary | ICD-10-CM

## 2014-01-18 DIAGNOSIS — I079 Rheumatic tricuspid valve disease, unspecified: Secondary | ICD-10-CM

## 2014-01-18 DIAGNOSIS — C109 Malignant neoplasm of oropharynx, unspecified: Principal | ICD-10-CM | POA: Diagnosis present

## 2014-01-18 DIAGNOSIS — R0602 Shortness of breath: Secondary | ICD-10-CM

## 2014-01-18 DIAGNOSIS — E43 Unspecified severe protein-calorie malnutrition: Secondary | ICD-10-CM

## 2014-01-18 DIAGNOSIS — I519 Heart disease, unspecified: Secondary | ICD-10-CM | POA: Diagnosis present

## 2014-01-18 DIAGNOSIS — R634 Abnormal weight loss: Secondary | ICD-10-CM | POA: Diagnosis present

## 2014-01-18 DIAGNOSIS — Z950 Presence of cardiac pacemaker: Secondary | ICD-10-CM

## 2014-01-18 DIAGNOSIS — J9601 Acute respiratory failure with hypoxia: Secondary | ICD-10-CM

## 2014-01-18 DIAGNOSIS — I4891 Unspecified atrial fibrillation: Secondary | ICD-10-CM

## 2014-01-18 DIAGNOSIS — Z8546 Personal history of malignant neoplasm of prostate: Secondary | ICD-10-CM

## 2014-01-18 DIAGNOSIS — IMO0002 Reserved for concepts with insufficient information to code with codable children: Secondary | ICD-10-CM

## 2014-01-18 DIAGNOSIS — Z681 Body mass index (BMI) 19 or less, adult: Secondary | ICD-10-CM

## 2014-01-18 DIAGNOSIS — J441 Chronic obstructive pulmonary disease with (acute) exacerbation: Secondary | ICD-10-CM

## 2014-01-18 DIAGNOSIS — Z85048 Personal history of other malignant neoplasm of rectum, rectosigmoid junction, and anus: Secondary | ICD-10-CM

## 2014-01-18 DIAGNOSIS — I272 Pulmonary hypertension, unspecified: Secondary | ICD-10-CM

## 2014-01-18 DIAGNOSIS — R229 Localized swelling, mass and lump, unspecified: Secondary | ICD-10-CM

## 2014-01-18 DIAGNOSIS — R0789 Other chest pain: Secondary | ICD-10-CM

## 2014-01-18 DIAGNOSIS — J449 Chronic obstructive pulmonary disease, unspecified: Secondary | ICD-10-CM | POA: Diagnosis present

## 2014-01-18 DIAGNOSIS — I442 Atrioventricular block, complete: Secondary | ICD-10-CM

## 2014-01-18 DIAGNOSIS — C210 Malignant neoplasm of anus, unspecified: Secondary | ICD-10-CM

## 2014-01-18 DIAGNOSIS — I5189 Other ill-defined heart diseases: Secondary | ICD-10-CM

## 2014-01-18 DIAGNOSIS — Z9221 Personal history of antineoplastic chemotherapy: Secondary | ICD-10-CM

## 2014-01-18 DIAGNOSIS — I359 Nonrheumatic aortic valve disorder, unspecified: Secondary | ICD-10-CM

## 2014-01-18 DIAGNOSIS — Z79899 Other long term (current) drug therapy: Secondary | ICD-10-CM

## 2014-01-18 DIAGNOSIS — Z87891 Personal history of nicotine dependence: Secondary | ICD-10-CM

## 2014-01-18 DIAGNOSIS — R9430 Abnormal result of cardiovascular function study, unspecified: Secondary | ICD-10-CM

## 2014-01-18 DIAGNOSIS — J4489 Other specified chronic obstructive pulmonary disease: Secondary | ICD-10-CM

## 2014-01-18 DIAGNOSIS — I2789 Other specified pulmonary heart diseases: Secondary | ICD-10-CM

## 2014-01-18 DIAGNOSIS — Z0000 Encounter for general adult medical examination without abnormal findings: Secondary | ICD-10-CM

## 2014-01-18 DIAGNOSIS — R220 Localized swelling, mass and lump, head: Secondary | ICD-10-CM

## 2014-01-18 DIAGNOSIS — I10 Essential (primary) hypertension: Secondary | ICD-10-CM

## 2014-01-18 HISTORY — DX: Headache: R51

## 2014-01-18 HISTORY — DX: Shortness of breath: R06.02

## 2014-01-18 HISTORY — DX: Headache, unspecified: R51.9

## 2014-01-18 LAB — BASIC METABOLIC PANEL
BUN: 12 mg/dL (ref 6–23)
CO2: 33 mEq/L — ABNORMAL HIGH (ref 19–32)
Calcium: 9.3 mg/dL (ref 8.4–10.5)
Chloride: 101 mEq/L (ref 96–112)
Creatinine, Ser: 0.84 mg/dL (ref 0.50–1.35)
GFR calc Af Amer: >90 mL/min (ref >90)
GFR calc non Af Amer: 80 mL/min — ABNORMAL LOW (ref >90)
Glucose, Bld: 83 mg/dL (ref 70–99)
Potassium: 3.6 mEq/L — ABNORMAL LOW (ref 3.7–5.3)
Sodium: 146 mEq/L (ref 137–147)

## 2014-01-18 LAB — MDC_IDC_ENUM_SESS_TYPE_INCLINIC
Battery Impedance: 1184 Ohm
Battery Remaining Longevity: 46 mo
Battery Voltage: 2.77 V
Brady Statistic AP VP Percent: 24 %
Brady Statistic AP VS Percent: 0 %
Brady Statistic AS VP Percent: 76 %
Brady Statistic AS VS Percent: 0 %
Lead Channel Impedance Value: 403 Ohm
Lead Channel Pacing Threshold Amplitude: 0.75 V
Lead Channel Pacing Threshold Amplitude: 1 V
Lead Channel Sensing Intrinsic Amplitude: 1 mV
Lead Channel Setting Pacing Amplitude: 2.5 V
Lead Channel Setting Pacing Pulse Width: 0.4 ms
MDC IDC MSMT LEADCHNL RA PACING THRESHOLD PULSEWIDTH: 0.4 ms
MDC IDC MSMT LEADCHNL RV IMPEDANCE VALUE: 489 Ohm
MDC IDC MSMT LEADCHNL RV PACING THRESHOLD PULSEWIDTH: 0.4 ms
MDC IDC SESS DTM: 20150630130115
MDC IDC SET LEADCHNL RA PACING AMPLITUDE: 2 V
MDC IDC SET LEADCHNL RV SENSING SENSITIVITY: 2.8 mV

## 2014-01-18 LAB — CBC WITH DIFFERENTIAL/PLATELET
Basophils Absolute: 0 10*3/uL (ref 0.0–0.1)
Basophils Relative: 0 % (ref 0–1)
Eosinophils Absolute: 0.2 10*3/uL (ref 0.0–0.7)
Eosinophils Relative: 2 % (ref 0–5)
HCT: 35.3 % — ABNORMAL LOW (ref 39.0–52.0)
Hemoglobin: 11 g/dL — ABNORMAL LOW (ref 13.0–17.0)
Lymphocytes Relative: 10 % — ABNORMAL LOW (ref 12–46)
Lymphs Abs: 0.8 10*3/uL (ref 0.7–4.0)
MCH: 25.3 pg — ABNORMAL LOW (ref 26.0–34.0)
MCHC: 31.2 g/dL (ref 30.0–36.0)
MCV: 81.3 fL (ref 78.0–100.0)
Monocytes Absolute: 0.5 10*3/uL (ref 0.1–1.0)
Monocytes Relative: 6 % (ref 3–12)
Neutro Abs: 6.7 10*3/uL (ref 1.7–7.7)
Neutrophils Relative %: 82 % — ABNORMAL HIGH (ref 43–77)
Platelets: 153 10*3/uL (ref 150–400)
RBC: 4.34 MIL/uL (ref 4.22–5.81)
RDW: 15.9 % — ABNORMAL HIGH (ref 11.5–15.5)
WBC: 8.2 10*3/uL (ref 4.0–10.5)

## 2014-01-18 LAB — TROPONIN I: Troponin I: <0.3 ng/mL (ref <0.30)

## 2014-01-18 MED ORDER — PHENOL 1.4 % MT LIQD
1.0000 | OROMUCOSAL | Status: DC | PRN
  Filled 2014-01-18: qty 177

## 2014-01-18 MED ORDER — GUAIFENESIN ER 600 MG PO TB12
600.0000 mg | ORAL_TABLET | Freq: Two times a day (BID) | ORAL | Status: DC
  Administered 2014-01-18 – 2014-01-20 (×3): 600 mg via ORAL
  Filled 2014-01-18 (×5): qty 1

## 2014-01-18 MED ORDER — ACETAMINOPHEN 325 MG PO TABS: 650.0000 mg | ORAL_TABLET | Freq: Four times a day (QID) | ORAL | Status: DC | PRN

## 2014-01-18 MED ORDER — ONDANSETRON HCL 4 MG/2ML IJ SOLN: 4.0000 mg | Freq: Four times a day (QID) | INTRAMUSCULAR | Status: DC | PRN

## 2014-01-18 MED ORDER — ENOXAPARIN SODIUM 40 MG/0.4ML ~~LOC~~ SOLN
40.0000 mg | SUBCUTANEOUS | Status: DC
  Administered 2014-01-18 – 2014-01-19 (×2): 40 mg via SUBCUTANEOUS
  Filled 2014-01-18 (×3): qty 0.4

## 2014-01-18 MED ORDER — ACETAMINOPHEN 650 MG RE SUPP: 650.0000 mg | Freq: Four times a day (QID) | RECTAL | Status: DC | PRN

## 2014-01-18 MED ORDER — LEVALBUTEROL HCL 0.63 MG/3ML IN NEBU
0.6300 mg | INHALATION_SOLUTION | Freq: Four times a day (QID) | RESPIRATORY_TRACT | Status: DC
  Administered 2014-01-18: 0.63 mg via RESPIRATORY_TRACT
  Filled 2014-01-18: qty 3

## 2014-01-18 MED ORDER — IOHEXOL 300 MG/ML  SOLN
80.0000 mL | Freq: Once | INTRAMUSCULAR | Status: AC | PRN
  Administered 2014-01-18: 80 mL via INTRAVENOUS

## 2014-01-18 MED ORDER — TIOTROPIUM BROMIDE MONOHYDRATE 18 MCG IN CAPS
18.0000 ug | ORAL_CAPSULE | Freq: Every day | RESPIRATORY_TRACT | Status: DC
  Administered 2014-01-19 – 2014-01-20 (×2): 18 ug via RESPIRATORY_TRACT
  Filled 2014-01-18: qty 5

## 2014-01-18 MED ORDER — ONDANSETRON HCL 4 MG PO TABS: 4.0000 mg | ORAL_TABLET | Freq: Four times a day (QID) | ORAL | Status: DC | PRN

## 2014-01-18 MED ORDER — ADULT MULTIVITAMIN W/MINERALS CH
1.0000 | ORAL_TABLET | Freq: Every day | ORAL | Status: DC
Start: 2014-01-18 — End: 2014-01-20
  Administered 2014-01-18 – 2014-01-20 (×2): 1 via ORAL
  Filled 2014-01-18 (×3): qty 1

## 2014-01-18 MED ORDER — ASPIRIN 81 MG PO CHEW
81.0000 mg | CHEWABLE_TABLET | Freq: Every day | ORAL | Status: DC
  Administered 2014-01-20: 81 mg via ORAL
  Filled 2014-01-18 (×2): qty 1

## 2014-01-18 MED ORDER — SODIUM CHLORIDE 0.9 % IJ SOLN
3.0000 mL | Freq: Two times a day (BID) | INTRAMUSCULAR | Status: DC
  Administered 2014-01-18 – 2014-01-20 (×3): 3 mL via INTRAVENOUS

## 2014-01-18 MED ORDER — CARVEDILOL 3.125 MG PO TABS
3.1250 mg | ORAL_TABLET | Freq: Two times a day (BID) | ORAL | Status: DC
  Administered 2014-01-18 – 2014-01-20 (×4): 3.125 mg via ORAL
  Filled 2014-01-18 (×5): qty 1

## 2014-01-18 MED ORDER — OXYCODONE HCL 5 MG PO TABS: 5.0000 mg | ORAL_TABLET | ORAL | Status: DC | PRN

## 2014-01-18 NOTE — Progress Notes (Signed)
Report received from ED RN for admission to 5W19  4:58 PM

## 2014-01-18 NOTE — Progress Notes (Signed)
HPI Mr. Turck returns today for followup. He is a pleasant 80 yo man with a h/o CHB, s/p PPM insertion. He also has HTN and COPD. He has pulmonary HTN. No chest pain or sob. Minimal peripheral edema. He has had left sided jaw pain and is thought to have a head and neck tumor which has not had a tissue diagnosis. He has lost over 30 lbs. Today his main complaint is sob. He denies peripheral edema. In the office he walked in and had an oxygen saturation of 81%. We placed oxygen on him and his sats improved. He does not have oxygen at home. He does not have a primary care MD. No Known Allergies   Current Outpatient Prescriptions  Medication Sig Dispense Refill  . carvedilol (COREG) 3.125 MG tablet Take 1 tablet (3.125 mg total) by mouth 2 (two) times daily.  180 tablet  3   No current facility-administered medications for this visit.     Past Medical History  Diagnosis Date  . Symptomatic bradycardia     and complete heart block - s/p dual chamber Medtronic pacemaker insertion (model [id number]) - followed by [facility]  . Hypertension   . Tricuspid regurgitation     mild/moderate  . COPD (chronic obstructive pulmonary disease)   . Prostate cancer 2009    followed by Dr. Jamse Arn, pt reports he is s/p radiation/ chemotherapy (pending records)  . Aortic valve calcification   . Pulmonary HTN      PA pressure stable echo, June, 2012, 49 mmHg  moderate - with PA peak pressure 49 mmHg (per 2-D echo 12/2010)  . Rectal cancer 04/2008    rectal mass biopsy showing invasive, poorly differentiated squamous cell carcinoma  . Diastolic dysfunction     grade 1 diastolic dysfunction per 2-D echo (12/2010) , normal LV systolic function with EF 55-60%  . Pacemaker     Bradycardia and AV block, Dr. Lovena Le  . Ejection fraction     Normal, echo, 2009, 57%, nuclear, 2009, / EF 55-60%, echo, June, 2012  . Chest discomfort     Nuclear, 2009, no ischemia  . SOB (shortness of breath) 01/18/14    ROS:   All  systems reviewed and negative except as noted in the HPI.   Past Surgical History  Procedure Laterality Date  . Appendectomy  1957  . Pacemaker insertion  09/2007    Medtronic dual-chamber pacemaker (model [id number]) in setting of sympomatic bradycardia and complete heart block. By Dr. Lovena Le ([facility])     No family history on file.   History   Social History  . Marital Status: Legally Separated    Spouse Name: N/A    Number of Children: 4  . Years of Education: 11th grade   Occupational History  . 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   Social History Main Topics  . Smoking status: Current Every Day Smoker -- 64 years    Types: Pipe  . Smokeless tobacco: Never Used     Comment: May smoke once or twice a week from his pipe.   . Alcohol Use: No  . Drug Use: No  . Sexual Activity: Not on file   Other Topics Concern  . Not on file   Social History Narrative   Lives alone, widowed, [employer], kids live out of state, but he has a niece who is local and with whom he is close.      BP 126/70  Pulse 92  Ht 6' (1.829 m)  Wt 110 lb (49.896 kg)  BMI 14.92 kg/m2  SpO2 84%  Physical Exam:  Both chronically and acutely appearing 80 yo woman, NAD HEENT: Unremarkable but tender over left jaw.  Neck:  7 cm JVD, no thyromegally Back:  No CVA tenderness Lungs:  Clear with no wheezes, rales, or rhonchi HEART:  Regular rate rhythm, no murmurs, no rubs, no clicks Abd:  soft, positive bowel sounds, no organomegally, no rebound, no guarding Ext:  2 plus pulses, no edema, no cyanosis, no clubbing Skin:  No rashes no nodules Neuro:  CN II through XII intact, motor grossly intact  DEVICE  Normal device function.  See PaceArt for details.   Assess/Plan:

## 2014-01-18 NOTE — Assessment & Plan Note (Signed)
His oxygen saturation is low. The etiology is unclear. He has no oxygen at home. I have recommended he be evaluated in the hospital and will send him to the ER. I do not think that this is a primary cardiac problem. He has had failure to thrive and a massive weight loss.

## 2014-01-18 NOTE — ED Provider Notes (Signed)
CSN: [id number]     Arrival date & time 01/18/14  1114 History   First MD Initiated Contact with Patient 01/18/14 1124     Chief Complaint  Patient presents with  . Shortness of Breath     (Consider location/radiation/quality/duration/timing/severity/associated sxs/prior Treatment) HPI Pt is an 80yo male with hx of symptomatic bradycardia with Medtronic pacemaker in place, HTN, tricuspid regurgitation, COPD, prostate cancer (2009), pulmonary HTN, rectal cancer (2009), and SOB sent to ED from his cardiologist, Dr. Cristopher Peru, to be admitted by hospitalist for further evaluation of SOB, productive cough, and evaluation of cancer due to significant weight loss over last few months.  Pt is 6', weighing 90lb today.  Pt reports having productive cough for several weeks, associated with a sore throat on left side with a "small mass" on left side of neck, and difficulty swallowing over last few weeks.  Denies chest pain, fever, chills, n/v/d. Pt states he did use to smoke marijuana and occasionally cigarettes.  Pt was placed on 2L [place] oxygen when he arrived to ED as his O2 was 84% on RA.  Pt is not normally on O2 at home.   Pt sent from Dr. Lovena Le, cardiology: pace maker was interrogated while at cardiologist's office   Past Medical History  Diagnosis Date  . Symptomatic bradycardia     and complete heart block - s/p dual chamber Medtronic pacemaker insertion (model [id number]) - followed by [facility]  . Hypertension   . Tricuspid regurgitation     mild/moderate  . COPD (chronic obstructive pulmonary disease)   . Aortic valve calcification   . Pulmonary HTN      PA pressure stable echo, June, 2012, 49 mmHg  moderate - with PA peak pressure 49 mmHg (per 2-D echo 12/2010)  . Diastolic dysfunction     grade 1 diastolic dysfunction per 2-D echo (12/2010) , normal LV systolic function with EF 55-60%  . Pacemaker     Bradycardia and AV block, Dr. Lovena Le  . Ejection fraction     Normal, echo, 2009,  57%, nuclear, 2009, / EF 55-60%, echo, June, 2012  . Chest discomfort     Nuclear, 2009, no ischemia  . SOB (shortness of breath) 01/18/14  . Prostate cancer 2009    followed by Dr. Jamse Arn, pt reports he is s/p radiation/ chemotherapy (pending records)  . Rectal cancer 04/2008    rectal mass biopsy showing invasive, poorly differentiated squamous cell carcinoma  . Daily headache     "recently" (01/18/2014)   Past Surgical History  Procedure Laterality Date  . Appendectomy  1957  . Insert / replace / remove pacemaker  09/2007    Medtronic dual-chamber pacemaker (model [id number]) in setting of sympomatic bradycardia and complete heart block. By Dr. Lovena Le [facility])  . Excisional hemorrhoidectomy  2009  . Prostate biopsy  04/2008  . Biopsy rectal  04/2008   History reviewed. No pertinent family history. History  Substance Use Topics  . Smoking status: Former Smoker -- 64 years    Types: Pipe, Cigarettes  . Smokeless tobacco: Never Used     Comment: 01/18/2014 "quit smoking for good ~ 04/2013"  . Alcohol Use: Yes     Comment: "quit drinking in the 1970's"    Review of Systems  Constitutional: Positive for appetite change and unexpected weight change ( clothes fitting looser  x "a few months"). Negative for fever, chills, diaphoresis and fatigue.  HENT: Positive for sore throat and trouble swallowing. Negative for congestion,  tinnitus and voice change.   Respiratory: Positive for shortness of breath. Negative for cough, wheezing and stridor.   Cardiovascular: Negative for chest pain and palpitations.  Gastrointestinal: Negative for nausea, vomiting, abdominal pain and diarrhea.  All other systems reviewed and are negative.     Allergies  Review of patient's allergies indicates no known allergies.  Home Medications   Prior to Admission medications   Medication Sig Start Date End Date Taking? Authorizing Provider  carvedilol (COREG) 3.125 MG tablet Take 1 tablet (3.125 mg  total) by mouth 2 (two) times daily. 08/06/13  Yes Evans Lance, MD  Multiple Vitamin (MULTIVITAMIN WITH MINERALS) TABS tablet Take 1 tablet by mouth daily.   Yes Historical Provider, MD   BP 156/70  Pulse 69  Temp(Src) 97.5 F (36.4 C) (Oral)  Resp 18  Ht 6' (1.829 m)  Wt 112 lb 6.4 oz (50.984 kg)  BMI 15.24 kg/m2  SpO2 93% Physical Exam  Nursing note and vitals reviewed. Constitutional: He appears cachectic. He has a sickly appearance.  HENT:  Head: Normocephalic and atraumatic.  Right Ear: Hearing, tympanic membrane, external ear and ear canal normal.  Left Ear: Hearing, tympanic membrane, external ear and ear canal normal.  Nose: Nose normal.  Mouth/Throat: Mucous membranes are normal. Oral lesions present. No trismus in the jaw. Abnormal dentition. Dental caries present. No dental abscesses or uvula swelling.  mass on left soft palate, slight deviation of uvula to right.  Not erythematous. No exudates. No uvula swelling. Not characteristic of tonsillar abscess.   Eyes: Conjunctivae are normal. No scleral icterus.  Neck: Normal range of motion. Neck supple.  Cardiovascular: Normal rate, regular rhythm and normal heart sounds.   Pulmonary/Chest: Effort normal and breath sounds normal. No respiratory distress. He has no wheezes. He has no rales. He exhibits no tenderness.  Abdominal: Soft. Bowel sounds are normal. He exhibits no distension and no mass. There is no tenderness. There is no rebound and no guarding.  Musculoskeletal: Normal range of motion.  Lymphadenopathy:    He has cervical adenopathy.  Neurological: He is alert.  Skin: Skin is warm and dry.    ED Course  Procedures (including critical care time) Labs Review Labs Reviewed  CBC WITH DIFFERENTIAL - Abnormal; Notable for the following:    Hemoglobin 11.0 (*)    HCT 35.3 (*)    MCH 25.3 (*)    RDW 15.9 (*)    Neutrophils Relative % 82 (*)    Lymphocytes Relative 10 (*)    All other components within normal  limits  BASIC METABOLIC PANEL - Abnormal; Notable for the following:    Potassium 3.6 (*)    CO2 33 (*)    GFR calc non Af Amer 80 (*)    All other components within normal limits  TROPONIN I  BASIC METABOLIC PANEL  CBC    Imaging Review Dg Chest 2 View  01/18/2014   CLINICAL DATA:  Chest pain and shortness of breath with history of previous pacemaker placement; history of COPD and pulmonary hypertension  EXAM: CHEST  2 VIEW  COMPARISON:  Chest x-ray of July 02, 2012.  FINDINGS: The lungs are hyperinflated. The heart is normal in size. The pulmonary vascularity is prominent centrally and stable. There is no pulmonary edema. The permanent pacemaker is normal in position. The bony thorax is unremarkable.  IMPRESSION: There is hyperinflation consistent with COPD. There is no evidence of CHF, pneumonia nor other acute cardiopulmonary abnormality.   Electronically  Signed   By: David  Martinique   On: 01/18/2014 13:45   Ct Soft Tissue Neck W Contrast  01/18/2014   CLINICAL DATA:  Neck mass  EXAM: CT NECK WITH CONTRAST  TECHNIQUE: Multidetector CT imaging of the neck was performed using the standard protocol following the bolus administration of intravenous contrast.  CONTRAST:  36mL OMNIPAQUE IOHEXOL 300 MG/ML  SOLN  COMPARISON:  CT neck 11/01/2013  FINDINGS: Severe apical emphysema bilaterally with right apical scarring, stable.  Left arm injection of contrast. Left innominate vein is occluded secondary to pacemaker. Contrast fills multiple collaterals in the posterior neck. Despite this there is adequate opacification of the neck veins and arteries.  Large necrotic mass in the base the tongue on the left. Assuming the patient has not has surgery, this is felt to be a necrotic tumor rather than a surgical cavity. The mass extends into the left lateral oropharynx were the mass measures approximately 20 x 27 mm. There appears to be interval growth of tumor. There is thickening of the soft palate on the left  due to tumor extension.  Metastatic necrotic lymph nodes in the left neck are slightly larger. Left level 2 node measures 22 x 15 mm. Anterior and inferior to this is a 10 mm necrotic lymph nod. Left level 3 node on axial image 77 measures 7 mm but is necrotic and is consistent with tumor spread. This node was not necrotic previously.  Right level 2 node measures 9 mm and is unchanged. Right level 3 node on axial image 79 measures 9.3 mm and appears slightly larger. 9 mm supraclavicular node on the right is unchanged.  IMPRESSION: Interval growth of large mass in the left lateral oropharynx and base of the tongue. Necrotic lymph nodes on the left are more prominent. Right-sided adenopathy also slightly more prominent. Findings are compatible with carcinoma with progression.  Edema in the supraglottic airway, question tumor or therapy related. Correlate with direct visualization.  Severe apical emphysema bilaterally.   Electronically Signed   By: Franchot Gallo M.D.   On: 01/18/2014 15:56     EKG Interpretation   Date/Time:  Tuesday January 18 2014 11:40:19 EDT Ventricular Rate:  61 PR Interval:  146 QRS Duration: 154 QT Interval:  500 QTC Calculation: 504 R Axis:   -89 Text Interpretation:  Sinus rhythm Nonspecific IVCD with LAD Left  ventricular hypertrophy Probable inferior infarct, acute Anterior infarct,  old Artifact wide complex qrs, Hx of pacer, will repeat abnormal Confirmed  by Carmin Muskrat  MD [phone]) on 01/18/2014 11:47:50 AM      MDM   Final diagnoses:  Mass  Atrial fibrillation, unspecified  Chronic airway obstruction, not elsewhere classified  Neck mass    Pt is an 83yp male sent to ED for SOB, productive cough, and weight loss.  Pt also c/o sore throat and trouble swallowing for several months.  Pt sent from Dr. Lovena Le, cardiology: pace maker was interrogated while at cardiologist's office.  Pt is to be admitted to hospitalist for further evaluation and treatment.  Pt is on  2L [place] as pt was 84% on RA.  Pt is cachectic but alert and oriented. Denies pain at this time.  Will get labs: CBC, BMP, troponin.  Will also get CXR and CT soft tissue neck.   Labs: CBC, BMP, Troponin: unremarkable CXR: hyperinflation consistent with COPD, otherwise, unremarkable.   CT soft tissue neck: interval growth of large mass in left lateral oropharynx and base of tongue.  Necrotic lymph nodes on left are more prominent. Right-sided adenopathy also slightly more prominent.  Findings compatible with carcinoma with progression.  Severe apical emphysema bilaterally.   Will consult with hospitalist to admit per question of Dr. Lovena Le, cardiologist, for further evaluation of new finding of carcinoma with progression in oropharynx, associated with significant weight loss and SOB.  Pt able to manage secretions. Resting comfortable in exam bed with 2L [place], O2-100%.  ENT not consulted at this time as airways is patent.   Pt admitted by Dr. Dillard Essex.    Noland Fordyce, PA-C 01/18/14 1936

## 2014-01-18 NOTE — ED Notes (Signed)
PT REPORTS WAS AT PCP TODAY, OXYGEN WAS 84% ON RA, PLACED ON 2L WAS 99%. REPORTS COUGH, AND PRODUCTION FOR SEVERAL WEEKS. PT IS A X 4. IS ON 2L. DENIES PAIN. HAS PAPERS WITH HIM FOR HOSPITALIST TO ADMIT.

## 2014-01-18 NOTE — Patient Instructions (Signed)
Your physician recommends that you schedule a follow-up appointment in: 6 months with the [facility] and 12 months with Dr.Taylor

## 2014-01-18 NOTE — H&P (Addendum)
[facility] History and Physical  [name] YTK:[id number] DOB: 09-07-1929 DOA: 01/18/2014  Referring physician: EDP PCP: Cristopher Peru, MD   Chief Complaint: SOB  And weight loss   HPI: [name] is a 80 y.o. male former smoker(states he quit cigs <69yr ago) with medical problems as listed below including COPD, HTN, Pulm HTN, bradycardia, AV block s/p pacemeaker, Anal CA and prostate ca in the past who presents with the above complaints. He states that he went for his follow up cards appt today and c/o SOB and weight loss, and was asked to come to the ED. In the ED O2 sat was found to be 84% and improved to the 90s after [place] O2 was applied. The pt relates that he has had intermittent SOB for >A few years, and the past 2-27mos been loosing weight - per cards office note today- about 30lb weight loss.He admits to a cough  Productive of phlegm, denies hemoptysis, CP, and no drooling. He also denies leg swelling, and no fevers. He admits to sore throat and per cards reported L. Jaw pain earlier, denies dysphagia. He was seen in the ED and CT of neck showed Interval growth of large mass in the left lateral oropharynx and base of the tongue. Necrotic lymph nodes on the left are more prominent. Right-sided adenopathy also slightly more prominent. Findings are compatible with carcinoma with progression. Edema in the supraglottic airway, question tumor or therapy related.It is noted that pt had CT neck 10/2013 which showed mass as well but has not had a follow up  biopsy for bx and treatment plan. He is admitted for further eval and management.     Review of Systems The patient denies anorexia, fever,, vision loss, decreased hearing, hoarseness, chest pain, syncope,  peripheral edema, balance deficits, hemoptysis, abdominal pain, melena, hematochezia, severe indigestion/heartburn, hematuria, incontinence,, muscle weakness, suspicious skin lesions, transient blindness, difficulty walking, depression.    Past Medical History  Diagnosis Date  . Symptomatic bradycardia     and complete heart block - s/p dual chamber Medtronic pacemaker insertion (model [id number]) - followed by [facility]  . Hypertension   . Tricuspid regurgitation     mild/moderate  . COPD (chronic obstructive pulmonary disease)   . Prostate cancer 2009    followed by Dr. Jamse Arn, pt reports he is s/p radiation/ chemotherapy (pending records)  . Aortic valve calcification   . Pulmonary HTN      PA pressure stable echo, June, 2012, 49 mmHg  moderate - with PA peak pressure 49 mmHg (per 2-D echo 12/2010)  . Rectal cancer 04/2008    rectal mass biopsy showing invasive, poorly differentiated squamous cell carcinoma  . Diastolic dysfunction     grade 1 diastolic dysfunction per 2-D echo (12/2010) , normal LV systolic function with EF 55-60%  . Pacemaker     Bradycardia and AV block, Dr. Lovena Le  . Ejection fraction     Normal, echo, 2009, 57%, nuclear, 2009, / EF 55-60%, echo, June, 2012  . Chest discomfort     Nuclear, 2009, no ischemia  . SOB (shortness of breath) 01/18/14   Past Surgical History  Procedure Laterality Date  . Appendectomy  1957  . Pacemaker insertion  09/2007    Medtronic dual-chamber pacemaker (model [id number]) in setting of sympomatic bradycardia and complete heart block. By Dr. Lovena Le ([facility])   Social History:  reports that he has quit smoking. His smoking use included Pipe. He has never used smokeless tobacco. He reports  that he does not drink alcohol or use illicit drugs.  No Known Allergies  No family history on file.   Prior to Admission medications   Medication Sig Start Date End Date Taking? Authorizing Provider  carvedilol (COREG) 3.125 MG tablet Take 1 tablet (3.125 mg total) by mouth 2 (two) times daily. 08/06/13  Yes Evans Lance, MD  Multiple Vitamin (MULTIVITAMIN WITH MINERALS) TABS tablet Take 1 tablet by mouth daily.   Yes Historical Provider, MD   Physical Exam: Filed  Vitals:   01/18/14 1736  BP: 156/70  Pulse: 69  Temp: 97.5 F (36.4 C)  Resp: 18    BP 156/70  Pulse 69  Temp(Src) 97.5 F (36.4 C) (Oral)  Resp 18  Ht 6' (1.829 m)  Wt 50.984 kg (112 lb 6.4 oz)  BMI 15.24 kg/m2  SpO2 100% Constitutional: Vital signs reviewed.  Patient is a well-developed and thin appearing elderly male in no acute distress and cooperative with exam. Alert and oriented x3.  Head: Normocephalic and atraumatic Mouth: no erythema or exudates, poor dentition, MMM Eyes: PERRL, EOMI, conjunctivae normal, No scleral icterus.  Neck: Supple, Trachea midline normal ROM,Firm and mildly tender adenopathy in L. Cervical and submandibular area, thyromegaly, or carotid bruit present.  Cardiovascular: RRR, S1 normal, S2 normal, no MRG, pulses symmetric and intact bilaterally Pulmonary/Chest: normal respiratory effort, decreased air entry bil, no wheezes, rales, or rhonchi Abdominal: Soft. Non-tender, non-distended, bowel sounds are normal, no masses, organomegaly, or guarding present.  GU: no CVA tenderness Extremities: No cyanosis and no edema Neurological: A&O x3, Strength is normal and symmetric bilaterally, cranial nerve II-XII are grossly intact, no focal motor deficit, sensory intact to light touch bilaterally.  Skin: Warm, dry and intact. No rash, cyanosis, or clubbing.  Psychiatric: Normal mood and affect. speech and behavior is normal. Judgment and thought content normal.               Labs on Admission:  Basic Metabolic Panel:  Recent Labs Lab 01/18/14 1204  NA 146  K 3.6*  CL 101  CO2 33*  GLUCOSE 83  BUN 12  CREATININE 0.84  CALCIUM 9.3   Liver Function Tests: No results found for this basename: AST, ALT, ALKPHOS, BILITOT, PROT, ALBUMIN,  in the last 168 hours No results found for this basename: LIPASE, AMYLASE,  in the last 168 hours No results found for this basename: AMMONIA,  in the last 168 hours CBC:  Recent Labs Lab 01/18/14 1204  WBC  8.2  NEUTROABS 6.7  HGB 11.0*  HCT 35.3*  MCV 81.3  PLT 153   Cardiac Enzymes:  Recent Labs Lab 01/18/14 1204  TROPONINI <0.30    BNP (last 3 results) No results found for this basename: PROBNP,  in the last 8760 hours CBG: No results found for this basename: GLUCAP,  in the last 168 hours  Radiological Exams on Admission: Dg Chest 2 View  01/18/2014   CLINICAL DATA:  Chest pain and shortness of breath with history of previous pacemaker placement; history of COPD and pulmonary hypertension  EXAM: CHEST  2 VIEW  COMPARISON:  Chest x-ray of July 02, 2012.  FINDINGS: The lungs are hyperinflated. The heart is normal in size. The pulmonary vascularity is prominent centrally and stable. There is no pulmonary edema. The permanent pacemaker is normal in position. The bony thorax is unremarkable.  IMPRESSION: There is hyperinflation consistent with COPD. There is no evidence of CHF, pneumonia nor other acute cardiopulmonary abnormality.  Electronically Signed   By: David  Martinique   On: 01/18/2014 13:45   Ct Soft Tissue Neck W Contrast  01/18/2014   CLINICAL DATA:  Neck mass  EXAM: CT NECK WITH CONTRAST  TECHNIQUE: Multidetector CT imaging of the neck was performed using the standard protocol following the bolus administration of intravenous contrast.  CONTRAST:  49mL OMNIPAQUE IOHEXOL 300 MG/ML  SOLN  COMPARISON:  CT neck 11/01/2013  FINDINGS: Severe apical emphysema bilaterally with right apical scarring, stable.  Left arm injection of contrast. Left innominate vein is occluded secondary to pacemaker. Contrast fills multiple collaterals in the posterior neck. Despite this there is adequate opacification of the neck veins and arteries.  Large necrotic mass in the base the tongue on the left. Assuming the patient has not has surgery, this is felt to be a necrotic tumor rather than a surgical cavity. The mass extends into the left lateral oropharynx were the mass measures approximately 20 x 27 mm.  There appears to be interval growth of tumor. There is thickening of the soft palate on the left due to tumor extension.  Metastatic necrotic lymph nodes in the left neck are slightly larger. Left level 2 node measures 22 x 15 mm. Anterior and inferior to this is a 10 mm necrotic lymph nod. Left level 3 node on axial image 77 measures 7 mm but is necrotic and is consistent with tumor spread. This node was not necrotic previously.  Right level 2 node measures 9 mm and is unchanged. Right level 3 node on axial image 79 measures 9.3 mm and appears slightly larger. 9 mm supraclavicular node on the right is unchanged.  IMPRESSION: Interval growth of large mass in the left lateral oropharynx and base of the tongue. Necrotic lymph nodes on the left are more prominent. Right-sided adenopathy also slightly more prominent. Findings are compatible with carcinoma with progression.  Edema in the supraglottic airway, question tumor or therapy related. Correlate with direct visualization.  Severe apical emphysema bilaterally.   Electronically Signed   By: Franchot Gallo M.D.   On: 01/18/2014 15:56    EKG: Independently reviewed. Paced rhythm at rate of 60  Assessment/Plan Active Problems: L. Lateral Oropharyngeal and base of Tongue mass- c/w carcinoma with progression -As dicussed above in pt presenting with weight loss and although had CT neck in 10/2013 does not appear to have been aware of this nor followed up for further eval. - I have consulted ENT>> Dr Benjamine Mola states states he plans to to see him in am and set up for oupt biopsy -pain management   COPD, with hypoxia -untreated, was on no out pt meds -will place on spiriva, Nebs, mucolytics -he has no wheezes on exam, will hold off steroids for now follow and further treat accordingly  -continue supplemental O2 Probable severe Protein Calorie malnutrition -consult dietician, follow   Hypertension -continue outpt meds Atrial fibrillation  S/p  Pacemaker -continue coreg -he is not any anticoagulation -place on ASA H/o Anal & Prostate cancer    Code Status: full  Family Communication: none at bedside Disposition Plan: admit to Tele  Time spent: >30  [facility] Pager [phone]

## 2014-01-18 NOTE — Progress Notes (Signed)
Isaiah Cianci [id number] Code Status: fULL Admission Data: 01/18/2014 6:14 PM Attending Provider:  Dillard Essex FSF:SELTR Lovena Le, MD Consults/ Treatment Team:  [facility]  Boden Stucky is a 80 y.o. male patient admitted from ED awake, alert - oriented  X 3 - no acute distress noted.  VSS - Blood pressure 156/70, pulse 69, temperature 97.5 F (36.4 C), temperature source Oral, resp. rate 18, height 6' (1.829 m), weight 50.984 kg (112 lb 6.4 oz), SpO2 93.00%.    IV in place, occlusive dsg intact without redness.  Orientation to room, and floor completed with information packet given to patient/family.  Patient declined safety video at this time.  Admission INP armband ID verified with patient/family, and in place.   SR up x 2, fall assessment complete, with patient and family able to verbalize understanding of risk associated with falls, and verbalized understanding to call nsg before up out of bed.  Call light within reach, patient able to voice, and demonstrate understanding.  Scabs noted bilateral lower legs. Swelling inside left side of throat noted, MD aware. No complaints of breathing problems. No stridor noted.    Will cont to eval and treat per MD orders.  Delman Cheadle, RN 01/18/2014 6:14 PM

## 2014-01-19 ENCOUNTER — Inpatient Hospital Stay (HOSPITAL_COMMUNITY): Payer: Medicare PPO | Admitting: Anesthesiology

## 2014-01-19 ENCOUNTER — Encounter (HOSPITAL_COMMUNITY): Payer: Self-pay | Admitting: Anesthesiology

## 2014-01-19 ENCOUNTER — Encounter (HOSPITAL_COMMUNITY)
Admission: EM | Disposition: A | Discharge status: Home Health | Payer: Self-pay | Source: Home / Self Care | Attending: Internal Medicine

## 2014-01-19 ENCOUNTER — Encounter (HOSPITAL_COMMUNITY): Payer: Medicare PPO | Admitting: Anesthesiology

## 2014-01-19 ENCOUNTER — Inpatient Hospital Stay (HOSPITAL_COMMUNITY): Payer: Medicare PPO

## 2014-01-19 ENCOUNTER — Telehealth: Payer: Self-pay | Admitting: Hematology and Oncology

## 2014-01-19 DIAGNOSIS — I519 Heart disease, unspecified: Secondary | ICD-10-CM

## 2014-01-19 DIAGNOSIS — E43 Unspecified severe protein-calorie malnutrition: Secondary | ICD-10-CM | POA: Insufficient documentation

## 2014-01-19 DIAGNOSIS — C801 Malignant (primary) neoplasm, unspecified: Secondary | ICD-10-CM

## 2014-01-19 HISTORY — DX: Malignant (primary) neoplasm, unspecified: C80.1

## 2014-01-19 HISTORY — PX: DIRECT LARYNGOSCOPY: SHX5326

## 2014-01-19 LAB — CBC
HCT: 30.9 % — ABNORMAL LOW (ref 39.0–52.0)
HEMOGLOBIN: 9.8 g/dL — AB (ref 13.0–17.0)
MCH: 25.8 pg — AB (ref 26.0–34.0)
MCHC: 31.7 g/dL (ref 30.0–36.0)
MCV: 81.3 fL (ref 78.0–100.0)
Platelets: 161 10*3/uL (ref 150–400)
RBC: 3.8 MIL/uL — AB (ref 4.22–5.81)
RDW: 15.9 % — ABNORMAL HIGH (ref 11.5–15.5)
WBC: 11.2 10*3/uL — ABNORMAL HIGH (ref 4.0–10.5)

## 2014-01-19 LAB — BASIC METABOLIC PANEL
BUN: 10 mg/dL (ref 6–23)
CHLORIDE: 100 meq/L (ref 96–112)
CO2: 32 meq/L (ref 19–32)
CREATININE: 0.71 mg/dL (ref 0.50–1.35)
Calcium: 9.2 mg/dL (ref 8.4–10.5)
GFR calc Af Amer: >90 mL/min (ref >90)
GFR calc non Af Amer: 86 mL/min — ABNORMAL LOW (ref >90)
GLUCOSE: 71 mg/dL (ref 70–99)
Potassium: 3.6 mEq/L — ABNORMAL LOW (ref 3.7–5.3)
Sodium: 144 mEq/L (ref 137–147)

## 2014-01-19 LAB — SURGICAL PCR SCREEN
MRSA, PCR: NEGATIVE
Staphylococcus aureus: NEGATIVE

## 2014-01-19 SURGERY — LARYNGOSCOPY, DIRECT
Anesthesia: General | Site: Throat

## 2014-01-19 SURGERY — LARYNGOSCOPY, DIRECT
Anesthesia: General

## 2014-01-19 MED ORDER — LIDOCAINE HCL (CARDIAC) 20 MG/ML IV SOLN
INTRAVENOUS | Status: DC | PRN
  Administered 2014-01-19: 50 mg via INTRAVENOUS

## 2014-01-19 MED ORDER — SUCCINYLCHOLINE CHLORIDE 20 MG/ML IJ SOLN
INTRAMUSCULAR | Status: AC
  Filled 2014-01-19: qty 1

## 2014-01-19 MED ORDER — ENSURE COMPLETE PO LIQD
237.0000 mL | Freq: Three times a day (TID) | ORAL | Status: DC
  Administered 2014-01-19 – 2014-01-20 (×4): 237 mL via ORAL

## 2014-01-19 MED ORDER — MIDAZOLAM HCL 2 MG/2ML IJ SOLN
INTRAMUSCULAR | Status: AC
  Filled 2014-01-19: qty 2

## 2014-01-19 MED ORDER — OXYMETAZOLINE HCL 0.05 % NA SOLN
NASAL | Status: AC
  Filled 2014-01-19: qty 15

## 2014-01-19 MED ORDER — EPINEPHRINE HCL (NASAL) 0.1 % NA SOLN
NASAL | Status: DC | PRN
Start: 2014-01-19 — End: 2014-01-19
  Administered 2014-01-19: 30 mL via TOPICAL

## 2014-01-19 MED ORDER — ONDANSETRON HCL 4 MG/2ML IJ SOLN
INTRAMUSCULAR | Status: DC | PRN
  Administered 2014-01-19: 4 mg via INTRAVENOUS

## 2014-01-19 MED ORDER — FENTANYL CITRATE 0.05 MG/ML IJ SOLN
INTRAMUSCULAR | Status: AC
  Filled 2014-01-19: qty 5

## 2014-01-19 MED ORDER — LACTATED RINGERS IV SOLN
INTRAVENOUS | Status: DC | PRN
  Administered 2014-01-19: 10:00:00 via INTRAVENOUS

## 2014-01-19 MED ORDER — IOHEXOL 350 MG/ML SOLN
80.0000 mL | Freq: Once | INTRAVENOUS | Status: AC | PRN
  Administered 2014-01-19: 88 mL via INTRAVENOUS

## 2014-01-19 MED ORDER — PROPOFOL 10 MG/ML IV BOLUS
INTRAVENOUS | Status: DC | PRN
  Administered 2014-01-19: 140 mg via INTRAVENOUS

## 2014-01-19 MED ORDER — LACTATED RINGERS IV SOLN
INTRAVENOUS | Status: DC
  Administered 2014-01-19: 10:00:00 via INTRAVENOUS

## 2014-01-19 MED ORDER — DEXAMETHASONE SODIUM PHOSPHATE 10 MG/ML IJ SOLN
INTRAMUSCULAR | Status: AC
  Filled 2014-01-19: qty 1

## 2014-01-19 MED ORDER — FENTANYL CITRATE 0.05 MG/ML IJ SOLN: 25.0000 ug | INTRAMUSCULAR | Status: DC | PRN

## 2014-01-19 MED ORDER — LEVALBUTEROL HCL 0.63 MG/3ML IN NEBU
0.6300 mg | INHALATION_SOLUTION | Freq: Two times a day (BID) | RESPIRATORY_TRACT | Status: DC
  Administered 2014-01-19 – 2014-01-20 (×3): 0.63 mg via RESPIRATORY_TRACT
  Filled 2014-01-19 (×6): qty 3

## 2014-01-19 MED ORDER — FENTANYL CITRATE 0.05 MG/ML IJ SOLN
INTRAMUSCULAR | Status: DC | PRN
  Administered 2014-01-19: 50 ug via INTRAVENOUS

## 2014-01-19 MED ORDER — EPINEPHRINE HCL (NASAL) 0.1 % NA SOLN
NASAL | Status: AC
  Filled 2014-01-19: qty 30

## 2014-01-19 MED ORDER — MIDAZOLAM HCL 5 MG/5ML IJ SOLN
INTRAMUSCULAR | Status: DC | PRN
  Administered 2014-01-19: 1 mg via INTRAVENOUS

## 2014-01-19 SURGICAL SUPPLY — 20 items
CANISTER SUCTION 2500CC (MISCELLANEOUS) ×3 IMPLANT
CONT SPEC STER OR (MISCELLANEOUS) ×3 IMPLANT
COVER TABLE BACK 60X90 (DRAPES) ×3 IMPLANT
DRAPE PROXIMA HALF (DRAPES) ×3 IMPLANT
GAUZE SPONGE 4X4 16PLY XRAY LF (GAUZE/BANDAGES/DRESSINGS) ×3 IMPLANT
GLOVE BIO SURGEON STRL SZ7.5 (GLOVE) ×3 IMPLANT
GOWN STRL REUS W/ TWL LRG LVL3 (GOWN DISPOSABLE) ×2 IMPLANT
GOWN STRL REUS W/TWL LRG LVL3 (GOWN DISPOSABLE) ×4
GUARD TEETH (MISCELLANEOUS) ×3 IMPLANT
KIT ROOM TURNOVER OR (KITS) ×3 IMPLANT
MARKER SKIN DUAL TIP RULER LAB (MISCELLANEOUS) IMPLANT
NS IRRIG 1000ML POUR BTL (IV SOLUTION) ×3 IMPLANT
PAD ARMBOARD 7.5X6 YLW CONV (MISCELLANEOUS) ×6 IMPLANT
PATTIES SURGICAL .5 X1 (DISPOSABLE) ×3 IMPLANT
SOLUTION ANTI FOG 6CC (MISCELLANEOUS) IMPLANT
SPONGE GAUZE 4X4 12PLY (GAUZE/BANDAGES/DRESSINGS) ×3 IMPLANT
SURGILUBE 2OZ TUBE FLIPTOP (MISCELLANEOUS) ×3 IMPLANT
TOWEL OR 17X26 10 PK STRL BLUE (TOWEL DISPOSABLE) ×3 IMPLANT
TUBE CONNECTING 12'X1/4 (SUCTIONS) ×1
TUBE CONNECTING 12X1/4 (SUCTIONS) ×2 IMPLANT

## 2014-01-19 SURGICAL SUPPLY — 20 items
BLADE 10 SAFETY STRL DISP (BLADE) ×3 IMPLANT
CANISTER SUCTION 2500CC (MISCELLANEOUS) ×3 IMPLANT
COVER TABLE BACK 60X90 (DRAPES) ×3 IMPLANT
DRAPE PROXIMA HALF (DRAPES) ×3 IMPLANT
GLOVE BIO SURGEON STRL SZ7.5 (GLOVE) ×3 IMPLANT
GOWN STRL REUS W/ TWL LRG LVL3 (GOWN DISPOSABLE) ×2 IMPLANT
GOWN STRL REUS W/TWL LRG LVL3 (GOWN DISPOSABLE) ×4
GUARD TEETH (MISCELLANEOUS) ×3 IMPLANT
KIT ROOM TURNOVER OR (KITS) ×3 IMPLANT
MARKER SKIN DUAL TIP RULER LAB (MISCELLANEOUS) IMPLANT
NS IRRIG 1000ML POUR BTL (IV SOLUTION) ×3 IMPLANT
PAD ARMBOARD 7.5X6 YLW CONV (MISCELLANEOUS) ×6 IMPLANT
PATTIES SURGICAL .5 X1 (DISPOSABLE) IMPLANT
SOLUTION ANTI FOG 6CC (MISCELLANEOUS) IMPLANT
SPONGE GAUZE 4X4 12PLY (GAUZE/BANDAGES/DRESSINGS) ×3 IMPLANT
SURGILUBE 2OZ TUBE FLIPTOP (MISCELLANEOUS) ×3 IMPLANT
TOWEL OR 17X26 10 PK STRL BLUE (TOWEL DISPOSABLE) ×3 IMPLANT
TUBE CONNECTING 12'X1/4 (SUCTIONS) ×1
TUBE CONNECTING 12X1/4 (SUCTIONS) ×2 IMPLANT
WATER STERILE IRR 1000ML POUR (IV SOLUTION) ×3 IMPLANT

## 2014-01-19 NOTE — Consult Note (Signed)
Reason for Consult: Pharyngeal mass Referring Physician: Minette Headland, MD  HPI:  [name] is an 80 y.o. male who was admitted yesterday for treatment of his SOB and wt loss. His neck CT scan yesterday showed Interval growth of a large mass in the left lateral oropharynx and base of the tongue. Necrotic lymph nodes are noted bilaterally.  Findings are compatible with carcinoma.  It should be noted that the pt had a CT neck in the ER in April, which showed the same mass.  However, the patient did not f/u with his ENT referral. He was not aware that he had a tumor or cancer in his throat. Currently he c/o mild dysphagia and odynophagia. No significant hoarseness.  The patient is a former smoker(states he quit cigs <70yrago) with medical problems as listed below including COPD, HTN, Pulm HTN, bradycardia, AV block s/p pacemeaker, Anal CA and prostate ca in the past who presents with the above complaints. He states that he went for his follow up cards appt and c/o SOB and weight loss, and was asked to come to the ED. In the ED O2 sat was found to be 84% and improved to the 90s after [place] O2 was applied. The pt relates that he has had intermittent SOB for >A few years, and the past 2-354mobeen loosing weight - per cards office note- about 30lb weight loss.He admits to a cough, productive of phlegm, denies hemoptysis, CP, and no drooling. He also denies leg swelling, and no fevers.   Past Medical History  Diagnosis Date  . Symptomatic bradycardia     and complete heart block - s/p dual chamber Medtronic pacemaker insertion (model [id number]- followed by [facility]  . Hypertension   . Tricuspid regurgitation     mild/moderate  . COPD (chronic obstructive pulmonary disease)   . Aortic valve calcification   . Pulmonary HTN      PA pressure stable echo, June, 2012, 49 mmHg  moderate - with PA peak pressure 49 mmHg (per 2-D echo 12/2010)  . Diastolic dysfunction     grade 1 diastolic dysfunction per  2-D echo (12/2010) , normal LV systolic function with EF 55-60%  . Pacemaker     Bradycardia and AV block, Dr. TaLovena Le. Ejection fraction     Normal, echo, 2009, 57%, nuclear, 2009, / EF 55-60%, echo, June, 2012  . Chest discomfort     Nuclear, 2009, no ischemia  . SOB (shortness of breath) 01/18/14  . Prostate cancer 2009    followed by Dr. OdJamse Arnpt reports he is s/p radiation/ chemotherapy (pending records)  . Rectal cancer 04/2008    rectal mass biopsy showing invasive, poorly differentiated squamous cell carcinoma  . Daily headache     "recently" (01/18/2014)    Past Surgical History  Procedure Laterality Date  . Appendectomy  1957  . Insert / replace / remove pacemaker  09/2007    Medtronic dual-chamber pacemaker (model [id number] setting of sympomatic bradycardia and complete heart block. [facility]  . Excisional hemorrhoidectomy  2009  . Prostate biopsy  04/2008  . Biopsy rectal  04/2008    History reviewed. No pertinent family history.  Social History:  reports that he has quit smoking. His smoking use included Pipe and Cigarettes. He smoked 0.00 packs per day for 64 years. He has never used smokeless tobacco. He reports that he drinks alcohol. He reports that he does not use illicit drugs.  Allergies:  No Known Allergies  Medications:  I have reviewed the patient's current medications. Scheduled: . aspirin  81 mg Oral Daily  . carvedilol  3.125 mg Oral BID  . enoxaparin (LOVENOX) injection  40 mg Subcutaneous Q24H  . guaiFENesin  600 mg Oral BID  . levalbuterol  0.63 mg Nebulization BID  . multivitamin with minerals  1 tablet Oral Daily  . sodium chloride  3 mL Intravenous Q12H  . tiotropium  18 mcg Inhalation Daily   BWG:[id number], acetaminophen, ondansetron (ZOFRAN) IV, ondansetron, oxyCODONE, phenol  Results for orders placed during the hospital encounter of 01/18/14 (from the past 48 hour(s))  CBC WITH DIFFERENTIAL     Status: Abnormal    Collection Time    01/18/14 12:04 PM      Result Value Ref Range   WBC 8.2  4.0 - 10.5 K/uL   RBC 4.34  4.22 - 5.81 MIL/uL   Hemoglobin 11.0 (*) 13.0 - 17.0 g/dL   HCT 35.3 (*) 39.0 - 52.0 %   MCV 81.3  78.0 - 100.0 fL   MCH 25.3 (*) 26.0 - 34.0 pg   MCHC 31.2  30.0 - 36.0 g/dL   RDW 15.9 (*) 11.5 - 15.5 %   Platelets 153  150 - 400 K/uL   Neutrophils Relative % 82 (*) 43 - 77 %   Neutro Abs 6.7  1.7 - 7.7 K/uL   Lymphocytes Relative 10 (*) 12 - 46 %   Lymphs Abs 0.8  0.7 - 4.0 K/uL   Monocytes Relative 6  3 - 12 %   Monocytes Absolute 0.5  0.1 - 1.0 K/uL   Eosinophils Relative 2  0 - 5 %   Eosinophils Absolute 0.2  0.0 - 0.7 K/uL   Basophils Relative 0  0 - 1 %   Basophils Absolute 0.0  0.0 - 0.1 K/uL  BASIC METABOLIC PANEL     Status: Abnormal   Collection Time    01/18/14 12:04 PM      Result Value Ref Range   Sodium 146  137 - 147 mEq/L   Potassium 3.6 (*) 3.7 - 5.3 mEq/L   Chloride 101  96 - 112 mEq/L   CO2 33 (*) 19 - 32 mEq/L   Glucose, Bld 83  70 - 99 mg/dL   BUN 12  6 - 23 mg/dL   Creatinine, Ser 0.84  0.50 - 1.35 mg/dL   Calcium 9.3  8.4 - 10.5 mg/dL   GFR calc non Af Amer 80 (*) >90 mL/min   GFR calc Af Amer >90  >90 mL/min   Comment: (NOTE)     The eGFR has been calculated using the CKD EPI equation.     This calculation has not been validated in all clinical situations.     eGFR's persistently <90 mL/min signify possible Chronic Kidney     Disease.  TROPONIN I     Status: None   Collection Time    01/18/14 12:04 PM      Result Value Ref Range   Troponin I <0.30  <0.30 ng/mL   Comment:            Due to the release kinetics of cTnI,     a negative result within the first hours     of the onset of symptoms does not rule out     myocardial infarction with certainty.     If myocardial infarction is still suspected,     repeat the test at  appropriate intervals.    Dg Chest 2 View  01/18/2014   CLINICAL DATA:  Chest pain and shortness of breath with  history of previous pacemaker placement; history of COPD and pulmonary hypertension  EXAM: CHEST  2 VIEW  COMPARISON:  Chest x-ray of July 02, 2012.  FINDINGS: The lungs are hyperinflated. The heart is normal in size. The pulmonary vascularity is prominent centrally and stable. There is no pulmonary edema. The permanent pacemaker is normal in position. The bony thorax is unremarkable.  IMPRESSION: There is hyperinflation consistent with COPD. There is no evidence of CHF, pneumonia nor other acute cardiopulmonary abnormality.   Electronically Signed   By: David  Martinique   On: 01/18/2014 13:45   Ct Soft Tissue Neck W Contrast  01/18/2014   CLINICAL DATA:  Neck mass  EXAM: CT NECK WITH CONTRAST  TECHNIQUE: Multidetector CT imaging of the neck was performed using the standard protocol following the bolus administration of intravenous contrast.  CONTRAST:  21m OMNIPAQUE IOHEXOL 300 MG/ML  SOLN  COMPARISON:  CT neck 11/01/2013  FINDINGS: Severe apical emphysema bilaterally with right apical scarring, stable.  Left arm injection of contrast. Left innominate vein is occluded secondary to pacemaker. Contrast fills multiple collaterals in the posterior neck. Despite this there is adequate opacification of the neck veins and arteries.  Large necrotic mass in the base the tongue on the left. Assuming the patient has not has surgery, this is felt to be a necrotic tumor rather than a surgical cavity. The mass extends into the left lateral oropharynx were the mass measures approximately 20 x 27 mm. There appears to be interval growth of tumor. There is thickening of the soft palate on the left due to tumor extension.  Metastatic necrotic lymph nodes in the left neck are slightly larger. Left level 2 node measures 22 x 15 mm. Anterior and inferior to this is a 10 mm necrotic lymph nod. Left level 3 node on axial image 77 measures 7 mm but is necrotic and is consistent with tumor spread. This node was not necrotic previously.   Right level 2 node measures 9 mm and is unchanged. Right level 3 node on axial image 79 measures 9.3 mm and appears slightly larger. 9 mm supraclavicular node on the right is unchanged.  IMPRESSION: Interval growth of large mass in the left lateral oropharynx and base of the tongue. Necrotic lymph nodes on the left are more prominent. Right-sided adenopathy also slightly more prominent. Findings are compatible with carcinoma with progression.  Edema in the supraglottic airway, question tumor or therapy related. Correlate with direct visualization.  Severe apical emphysema bilaterally.   Electronically Signed   By: CFranchot GalloM.D.   On: 01/18/2014 15:56   Review of Systems  The patient denies fever, vision loss, decreased hearing, hoarseness, chest pain, syncope, peripheral edema, balance deficits, hemoptysis, abdominal pain, melena, hematochezia, severe indigestion/heartburn, hematuria, incontinence, muscle weakness, suspicious skin lesions, transient blindness, difficulty walking, depression.   Blood pressure 143/70, pulse 60, temperature 98.4 F (36.9 C), temperature source Oral, resp. rate 18, height 6' (1.829 m), weight 112 lb 6.4 oz (50.984 kg), SpO2 94.00%.  Physical Exam:  Constitutional: Vital signs reviewed. Patient is a cachetic and thin appearing elderly male in no acute distress. Cooperative with exam. Alert and oriented x3.  Head: Normocephalic and atraumatic  Ears: Normal auricles and EACs. Mouth: no erythema or exudates, poor dentition, MMM. Eyes: PERRL, EOMI, conjunctivae normal, No scleral icterus.  Neck: Supple, trachea midline  normal ROM,Firm and mildly tender adenopathy in L. cervical and submandibular area. Pulmonary/Chest: normal respiratory effort, decreased air entry bil, no wheezes, rales, or rhonchi  GU: no CVA tenderness Extremities: No cyanosis and no edema Neurological: A&O x3, Strength is normal and symmetric bilaterally, cranial nerve II-XII are grossly intact,  no focal motor deficit, sensory intact to light touch bilaterally.  Skin: Warm, dry and intact. No rash, cyanosis, or clubbing.  Psychiatric: Normal mood and affect. speech and behavior is normal. Judgment and thought content normal.   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  The findings are reviewed with the patient.  Plan to proceed with direct laryngoscopy and biopsy ASAP.  Keep NPO for now.  [name],SUI W 01/19/2014, 8:22 AM

## 2014-01-19 NOTE — Progress Notes (Signed)
PATIENT DETAILS Name: [name] Age: 80 y.o. 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 Melene Plan, underwent bx on 7/1, pending results. Spoke with ENT, okay to discharge, they will followup in the office in one week. Will need oncology followup, I will try to arrange prior to discharge.  COPD - Not actively wheezing. - Continue nebs and Spiriva.  Chronic diastolic dysfunction - Clinically compensated.  History of bradycardia and AV block - Status post permanent pacemaker implantation.  History of pulmonary hypertension - Continue outpatient followup    Protein-calorie malnutrition, severe - Supplements per nutrition  Disposition: Remain inpatient  DVT Prophylaxis: Prophylactic Lovenox   Code Status: Full code   Family Communication Son at bedside  Procedures:  Direct laryngoscopy with biopsy  CONSULTS:  ENT  Time spent 40 minutes-which includes 50% of the time with face-to-face with patient/ family and coordinating care related to the above assessment and plan.    MEDICATIONS: Scheduled Meds: . aspirin  81 mg Oral Daily  . carvedilol  3.125 mg Oral BID  . enoxaparin (LOVENOX) injection  40 mg Subcutaneous Q24H  . feeding supplement (ENSURE COMPLETE)  237 mL Oral TID BM  . guaiFENesin  600 mg Oral BID  . levalbuterol  0.63 mg Nebulization BID  . multivitamin with minerals  1 tablet Oral Daily  . sodium chloride  3 mL Intravenous Q12H  . tiotropium  18 mcg Inhalation Daily   Continuous Infusions: . lactated ringers 50 mL/hr at 01/19/14 0939   PRN Meds:.acetaminophen, acetaminophen, ondansetron (ZOFRAN) IV, ondansetron, oxyCODONE, phenol  Antibiotics: Anti-infectives   None       PHYSICAL EXAM: Vital signs in last 24 hours: Filed Vitals:   01/19/14 1045 01/19/14 1100 01/19/14 1119 01/19/14 1151  BP: 162/73 137/69 156/80 156/80  Pulse: 80 66 73 60  Temp:  97.6 F (36.4 C) 97.4 F (36.3 C) 97.2 F (36.2 C)  TempSrc:   Oral Oral  Resp: 15 23 18 18   Height:      Weight:      SpO2: 99% 96% 97% 99%    Weight change:  Filed Weights   01/18/14 1121 01/18/14 1736  Weight: 40.824 kg (90 lb) 50.984 kg (112 lb 6.4 oz)   Body mass index is 15.24 kg/(m^2).   Gen Exam: Awake and alert with clear speech.  Neck: Supple, No JVD.   Chest: B/L Clear.   CVS: S1 S2 Regular, no murmurs.  Abdomen: soft, BS +, non tender, non distended.  Extremities: no edema, lower extremities warm to touch. Neurologic: Non Focal.   Skin: No Rash.   Wounds: N/A.   Intake/Output from previous day:  Intake/Output Summary (Last 24 hours) at 01/19/14 1308 Last data filed at 01/19/14 1120  Gross per 24 hour  Intake    747 ml  Output    750 ml  Net     -3 ml     LAB RESULTS: CBC  Recent Labs Lab 01/18/14 1204 01/19/14 0830  WBC 8.2 11.2*  HGB 11.0* 9.8*  HCT 35.3* 30.9*  PLT 153 161  MCV 81.3 81.3  MCH 25.3* 25.8*  MCHC 31.2 31.7  RDW 15.9* 15.9*  LYMPHSABS 0.8  --   MONOABS 0.5  --  EOSABS 0.2  --   BASOSABS 0.0  --     Chemistries   Recent Labs Lab 01/18/14 1204 01/19/14 0742  NA 146 144  K 3.6* 3.6*  CL 101 100  CO2 33* 32  GLUCOSE 83 71  BUN 12 10  CREATININE 0.84 0.71  CALCIUM 9.3 9.2    CBG: No results found for this basename: GLUCAP,  in the last 168 hours  GFR Estimated Creatinine Clearance: 52.2 ml/min (by C-G formula based on Cr of 0.71).  Coagulation profile No results found for this basename: INR, PROTIME,  in the last 168 hours  Cardiac Enzymes  Recent Labs Lab 01/18/14 1204  TROPONINI <0.30    No components found with this basename: POCBNP,  No results found for this basename: DDIMER,  in the last 72  hours No results found for this basename: HGBA1C,  in the last 72 hours No results found for this basename: CHOL, HDL, LDLCALC, TRIG, CHOLHDL, LDLDIRECT,  in the last 72 hours No results found for this basename: TSH, T4TOTAL, FREET3, T3FREE, THYROIDAB,  in the last 72 hours No results found for this basename: VITAMINB12, FOLATE, FERRITIN, TIBC, IRON, RETICCTPCT,  in the last 72 hours No results found for this basename: LIPASE, AMYLASE,  in the last 72 hours  Urine Studies No results found for this basename: UACOL, UAPR, USPG, UPH, UTP, UGL, UKET, UBIL, UHGB, UNIT, UROB, ULEU, UEPI, UWBC, URBC, UBAC, CAST, CRYS, UCOM, BILUA,  in the last 72 hours  MICROBIOLOGY: Recent Results (from the past 240 hour(s))  SURGICAL PCR SCREEN     Status: None   Collection Time    01/19/14  9:10 AM      Result Value Ref Range Status   MRSA, PCR NEGATIVE  NEGATIVE Final   Staphylococcus aureus NEGATIVE  NEGATIVE Final   Comment:            The Xpert SA Assay (FDA     approved for NASAL specimens     in patients over 72 years of age),     is one component of     a comprehensive surveillance     program.  Test performance has     been validated by [employer] for patients greater     than or equal to 12 year old.     It is not intended     to diagnose infection nor to     guide or monitor treatment.    RADIOLOGY STUDIES/RESULTS: Dg Chest 2 View  01/18/2014   CLINICAL DATA:  Chest pain and shortness of breath with history of previous pacemaker placement; history of COPD and pulmonary hypertension  EXAM: CHEST  2 VIEW  COMPARISON:  Chest x-ray of July 02, 2012.  FINDINGS: The lungs are hyperinflated. The heart is normal in size. The pulmonary vascularity is prominent centrally and stable. There is no pulmonary edema. The permanent pacemaker is normal in position. The bony thorax is unremarkable.  IMPRESSION: There is hyperinflation consistent with COPD. There is no evidence of CHF, pneumonia nor  other acute cardiopulmonary abnormality.   Electronically Signed   By: David  Martinique   On: 01/18/2014 13:45   Ct Soft Tissue Neck W Contrast  01/18/2014   CLINICAL DATA:  Neck mass  EXAM: CT NECK WITH CONTRAST  TECHNIQUE: Multidetector CT imaging of the neck was performed using the standard protocol following the bolus administration of intravenous contrast.  CONTRAST:  74mL OMNIPAQUE IOHEXOL 300  MG/ML  SOLN  COMPARISON:  CT neck 11/01/2013  FINDINGS: Severe apical emphysema bilaterally with right apical scarring, stable.  Left arm injection of contrast. Left innominate vein is occluded secondary to pacemaker. Contrast fills multiple collaterals in the posterior neck. Despite this there is adequate opacification of the neck veins and arteries.  Large necrotic mass in the base the tongue on the left. Assuming the patient has not has surgery, this is felt to be a necrotic tumor rather than a surgical cavity. The mass extends into the left lateral oropharynx were the mass measures approximately 20 x 27 mm. There appears to be interval growth of tumor. There is thickening of the soft palate on the left due to tumor extension.  Metastatic necrotic lymph nodes in the left neck are slightly larger. Left level 2 node measures 22 x 15 mm. Anterior and inferior to this is a 10 mm necrotic lymph nod. Left level 3 node on axial image 77 measures 7 mm but is necrotic and is consistent with tumor spread. This node was not necrotic previously.  Right level 2 node measures 9 mm and is unchanged. Right level 3 node on axial image 79 measures 9.3 mm and appears slightly larger. 9 mm supraclavicular node on the right is unchanged.  IMPRESSION: Interval growth of large mass in the left lateral oropharynx and base of the tongue. Necrotic lymph nodes on the left are more prominent. Right-sided adenopathy also slightly more prominent. Findings are compatible with carcinoma with progression.  Edema in the supraglottic airway, question  tumor or therapy related. Correlate with direct visualization.  Severe apical emphysema bilaterally.   Electronically Signed   By: Franchot Gallo M.D.   On: 01/18/2014 15:56    Oren Binet, MD  [facility] Pager:[phone]  If 7PM-7AM, please contact night-coverage [email or web address] Password [email or web address] 01/19/2014, 1:08 PM   LOS: 1 day   **Disclaimer: This note may have been dictated with voice recognition software. Similar sounding words can inadvertently be transcribed and this note may contain transcription errors which may not have been corrected upon publication of note.**

## 2014-01-19 NOTE — Progress Notes (Signed)
Patient at rest Sp02 levels 79% on room air. Patient placed on 1L [place] of 02 and Sp02 90%. Md notified.

## 2014-01-19 NOTE — Op Note (Signed)
[name], [name]              ACCOUNT NO.:  [id number]  MEDICAL RECORD NO.:  [id number]  LOCATION:  5W19C                        FACILITY:  [facility]  PHYSICIAN:  Leta Baptist, MD            DATE OF BIRTH:  January 27, 1930  DATE OF PROCEDURE:  01/19/2014 DATE OF DISCHARGE:                              OPERATIVE REPORT   SURGEON:  Leta Baptist, MD.  PREOPERATIVE DIAGNOSIS:  Left oropharyngeal mass.  POSTOPERATIVE DIAGNOSIS:  Left oropharyngeal mass.  PROCEDURE PERFORMED:  Direct laryngoscopy with biopsy.  ANESTHESIA:  General endotracheal tube anesthesia.  COMPLICATIONS:  None.  ESTIMATED BLOOD LOSS:  Minimal.  INDICATION FOR PROCEDURE:  The patient is an 80 year old male, who was admitted yesterday for treatment of his shortness of breath and sudden weight loss.  On his neck CT scan, the patient was noted to have a large necrotic left tongue base and oropharyngeal mass.  He was also noted to have multiple necrotic left cervical lymph nodes.  The appearance were worrisome for metastatic squamous cell carcinoma.  Based on the above findings, the decision was made for the patient to undergo the laryngoscopy and biopsy procedure.  The risks, benefits, alternatives, and details of the procedure were discussed with the patient.  Questions were invited and answered.  Informed consent was obtained.  DESCRIPTION:  The patient was taken to the operating room and placed supine on the operating table.  General endotracheal tube anesthesia was administered by the anesthesiologist.  The patient was positioned, and prepped and draped in a standard fashion for laryngoscopy.  A Dedo laryngoscope was inserted into the oral cavity.  It was used to examined the oral pharynx.  A large necrotic mass was noted at the left tongue base, extending to the left lateral pharyngeal wall and a small portion of the left soft palate.  The mass also extends down to the superior portion of the pyriform sinus.  However,  the arytenoids, epiglottis, true and false vocal cords were free of disease.  Multiple specimens were obtained from the necrotic mass using large cup forceps.  The specimens were sent to the Pathology Department for a permanent histologic identification.  Hemostasis was achieved by applying pledgets soaked with epinephrine.  The patient was turned over to the anesthesiologist.  The patient was awakened from anesthesia without difficulty.  He was extubated and transferred to the recovery room in good condition.  OPERATIVE FINDINGS:  A large left oropharyngeal mass was noted.  It appears to involve the left tongue base, soft palate, and a lateral oropharyngeal wall.  The larynx was free of disease.  SPECIMENS:  Multiple biopsy specimens of the left oropharyngeal mass.  FOLLOW UP CARE:  The patient will be returned to his hospital bed after the procedure.     Leta Baptist, MD     ST/MEDQ  D:  01/19/2014  T:  01/19/2014  Job:  [id number]

## 2014-01-19 NOTE — Telephone Encounter (Signed)
S/W DR. GHIMIRE AND GAVE NP APPT FOR 07/06 @ 3 W/DR. [place].  REFERRING DR. Sloan Leiter DX- NEWLY DX HEAD/NECK CA

## 2014-01-19 NOTE — Anesthesia Postprocedure Evaluation (Signed)
  Anesthesia Post-op Note  Patient: [name]  Procedure(s) Performed: Procedure(s) with comments: DIRECT LARYNGOSCOPY (N/A) - with biopsy  Patient Location: PACU  Anesthesia Type:General  Level of Consciousness: awake  Airway and Oxygen Therapy: Patient Spontanous Breathing  Post-op Pain: mild  Post-op Assessment: Post-op Vital signs reviewed  Post-op Vital Signs: Reviewed  Last Vitals:  Filed Vitals:   01/19/14 1035  BP: 141/71  Pulse: 82  Temp: 36.9 C  Resp: 9    Complications: No apparent anesthesia complications

## 2014-01-19 NOTE — Anesthesia Preprocedure Evaluation (Addendum)
Anesthesia Evaluation  Patient identified by MRN, date of birth, ID band Patient awake    Reviewed: Allergy & Precautions, H&P , NPO status , Patient's Chart, lab work & pertinent test results  Airway Mallampati: II      Dental   Pulmonary shortness of breath, COPDformer smoker,          Cardiovascular hypertension, +CHF + dysrhythmias + pacemaker     Neuro/Psych    GI/Hepatic negative GI ROS, Neg liver ROS,   Endo/Other    Renal/GU negative Renal ROS     Musculoskeletal   Abdominal   Peds  Hematology   Anesthesia Other Findings   Reproductive/Obstetrics                          Anesthesia Physical Anesthesia Plan  ASA: III  Anesthesia Plan: General   Post-op Pain Management:    Induction: Intravenous  Airway Management Planned: Oral ETT  Additional Equipment:   Intra-op Plan:   Post-operative Plan: Possible Post-op intubation/ventilation  Informed Consent: I have reviewed the patients History and Physical, chart, labs and discussed the procedure including the risks, benefits and alternatives for the proposed anesthesia with the patient or authorized representative who has indicated his/her understanding and acceptance.   Dental advisory given  Plan Discussed with: CRNA and Anesthesiologist  Anesthesia Plan Comments:        Anesthesia Quick Evaluation

## 2014-01-19 NOTE — Brief Op Note (Signed)
01/18/2014 - 01/19/2014  10:09 AM  PATIENT:  [name]  80 y.o. male  PRE-OPERATIVE DIAGNOSIS:  oropharyngeal mass  POST-OPERATIVE DIAGNOSIS:  oropharyngeal mass  PROCEDURE:  Procedure(s) with comments: DIRECT LARYNGOSCOPY (N/A) - with biopsy  SURGEON:  Surgeon(s) and Role:    * Sui W Sol Englert, MD - Primary  PHYSICIAN ASSISTANT:   ASSISTANTS: none   ANESTHESIA:   general  EBL:     BLOOD ADMINISTERED:none  DRAINS: none   LOCAL MEDICATIONS USED:  OTHER Topical epinephrine  SPECIMEN:  Source of Specimen:  Left oropharyngeal mass biopsy  DISPOSITION OF SPECIMEN:  PATHOLOGY  COUNTS:  YES  TOURNIQUET:  * No tourniquets in log *  DICTATION: .Other Dictation: Dictation Number [id number]  PLAN OF CARE: Admit to inpatient   PATIENT DISPOSITION:  PACU - hemodynamically stable.   Delay start of Pharmacological VTE agent (>24hrs) due to surgical blood loss or risk of bleeding: not applicable

## 2014-01-19 NOTE — Progress Notes (Signed)
INITIAL NUTRITION ASSESSMENT  DOCUMENTATION CODES Per approved criteria  -Severe malnutrition in the context of chronic illness -Underweight   INTERVENTION: Recommend diet liberalization to Regular Add Ensure Complete po BID, each supplement provides 350 kcal and 13 grams of protein Discussed need for high protein foods with son Monitor magnesium, potassium, and phosphorus daily for at least 3 days, MD to replete as needed, as pt is at risk for refeeding syndrome given severe malnutrition RD to continue to follow nutrition care plan  NUTRITION DIAGNOSIS: Increased nutrient needs related to likely catabolic illness as evidenced by estimated needs.   Goal: Intake to meet >90% of estimated nutrition needs.  Monitor:  weight trends, lab trends, I/O's, PO intake, supplement tolerance  Reason for Assessment: MD Consult + Malnutrition Screening Tool  80 y.o. male  Admitting Dx: neck mass  ASSESSMENT: PMHx significant for former msoke, COPD, HTN, pulmonary HTN, bradycardia, AV block s/p pacemaker, anal and prostat CA. Admitted with SOB and weight loss. Work-up reveals large mass in the L lateral oropharynx and base of tongue.  Noted that ENT MD suspects that pat has tongue base SCCA with cervical metastasis. Underwent direct laryngoscopy of oropharyngeal mass this morning.  Pt currently advanced to Heart Healthy diet. Discussed intake with son at bedside. He reports that he and his wife prepare food for patient and take it to him daily, he does not stay with patient when he eats, so he is unable to state how much he eats at a time or how long it takes for him to eat a meal that is dropped off by him. He is a picky eater and has poor intake at baseline.  Nutrition Focused Physical Exam:  Subcutaneous Fat:  Orbital Region: moderate depletion Upper Arm Region: severe depletion Thoracic and Lumbar Region: severe depletion  Muscle:  Temple Region: severe depletion Clavicle Bone  Region: severe depletion Clavicle and Acromion Bone Region: severe depletion Scapular Bone Region: severe depletion Dorsal Hand: severe depletion Patellar Region: severe depletion Anterior Thigh Region: severe depletion Posterior Calf Region: severe depletion  Edema: peripheral edema  Pt meets criteria for severe MALNUTRITION in the context of chronic illness as evidenced by severe fat and muscle mass loss and suspected intake of <75% x at least 1 month.  Potassium low at 3.6  Height: Ht Readings from Last 1 Encounters:  01/18/14 6' (1.829 m)    Weight: Wt Readings from Last 1 Encounters:  01/18/14 112 lb 6.4 oz (50.984 kg)    Ideal Body Weight: 178 lb  % Ideal Body Weight: 63%  Wt Readings from Last 10 Encounters:  01/18/14 112 lb 6.4 oz (50.984 kg)  01/18/14 112 lb 6.4 oz (50.984 kg)  01/18/14 112 lb 6.4 oz (50.984 kg)  01/18/14 110 lb (49.896 kg)  11/01/13 112 lb 9.6 oz (51.075 kg)  12/11/12 134 lb 9.6 oz (61.054 kg)  07/23/12 134 lb (60.782 kg)  07/04/12 126 lb 12.8 oz (57.516 kg)  06/02/12 130 lb (58.968 kg)  12/03/11 130 lb 12.8 oz (59.33 kg)    Usual Body Weight: 135 lb - 1 year ago  % Usual Body Weight: 83%  BMI:  Body mass index is 15.24 kg/(m^2). Underweight  Estimated Nutritional Needs: Kcal: 1800 - 2000 Protein: at least 75 g daily Fluid: 1.8 - 2 liters  Skin: intact  Diet Order: Cardiac  EDUCATION NEEDS: -No education needs identified at this time   Intake/Output Summary (Last 24 hours) at 01/19/14 1113 Last data filed at 01/19/14 1017  Gross per 24 hour  Intake    747 ml  Output    550 ml  Net    197 ml    Last BM: 6/29  Labs:   Recent Labs Lab 01/18/14 1204 01/19/14 0742  NA 146 144  K 3.6* 3.6*  CL 101 100  CO2 33* 32  BUN 12 10  CREATININE 0.84 0.71  CALCIUM 9.3 9.2  GLUCOSE 83 71    CBG (last 3)  No results found for this basename: GLUCAP,  in the last 72 hours  Scheduled Meds: . aspirin  81 mg Oral Daily  .  carvedilol  3.125 mg Oral BID  . enoxaparin (LOVENOX) injection  40 mg Subcutaneous Q24H  . guaiFENesin  600 mg Oral BID  . levalbuterol  0.63 mg Nebulization BID  . multivitamin with minerals  1 tablet Oral Daily  . sodium chloride  3 mL Intravenous Q12H  . tiotropium  18 mcg Inhalation Daily    Continuous Infusions: . lactated ringers 50 mL/hr at 01/19/14 9147    Past Medical History  Diagnosis Date  . Symptomatic bradycardia     and complete heart block - s/p dual chamber Medtronic pacemaker insertion (model [id number]) - followed by [facility]  . Hypertension   . Tricuspid regurgitation     mild/moderate  . COPD (chronic obstructive pulmonary disease)   . Aortic valve calcification   . Pulmonary HTN      PA pressure stable echo, June, 2012, 49 mmHg  moderate - with PA peak pressure 49 mmHg (per 2-D echo 12/2010)  . Diastolic dysfunction     grade 1 diastolic dysfunction per 2-D echo (12/2010) , normal LV systolic function with EF 55-60%  . Pacemaker     Bradycardia and AV block, Dr. Lovena Le  . Ejection fraction     Normal, echo, 2009, 57%, nuclear, 2009, / EF 55-60%, echo, June, 2012  . Chest discomfort     Nuclear, 2009, no ischemia  . SOB (shortness of breath) 01/18/14  . Prostate cancer 2009    followed by Dr. Jamse Arn, pt reports he is s/p radiation/ chemotherapy (pending records)  . Rectal cancer 04/2008    rectal mass biopsy showing invasive, poorly differentiated squamous cell carcinoma  . Daily headache     "recently" (01/18/2014)    Past Surgical History  Procedure Laterality Date  . Appendectomy  1957  . Insert / replace / remove pacemaker  09/2007    Medtronic dual-chamber pacemaker (model [id number]) in setting of sympomatic bradycardia and complete heart block. By Dr. Lovena Le [facility])  . Excisional hemorrhoidectomy  2009  . Prostate biopsy  04/2008  . Biopsy rectal  04/2008    Inda Coke MS, RD, LDN Inpatient Registered Dietitian Pager:  [phone] After-hours pager: [phone]

## 2014-01-19 NOTE — Transfer of Care (Signed)
Immediate Anesthesia Transfer of Care Note  Patient: [name]  Procedure(s) Performed: Procedure(s) with comments: DIRECT LARYNGOSCOPY (N/A) - with biopsy  Patient Location: PACU  Anesthesia Type:General  Level of Consciousness: awake, sedated and patient cooperative  Airway & Oxygen Therapy: Patient Spontanous Breathing and Patient connected to nasal cannula oxygen  Post-op Assessment: Report given to PACU RN, Post -op Vital signs reviewed and stable and Patient moving all extremities  Post vital signs: Reviewed and stable  Complications: No apparent anesthesia complications

## 2014-01-19 NOTE — Anesthesia Postprocedure Evaluation (Signed)
  Anesthesia Post-op Note  Patient: [name]  Procedure(s) Performed: Procedure(s) with comments: DIRECT LARYNGOSCOPY (N/A) - with biopsy  Patient Location: PACU  Anesthesia Type:General  Level of Consciousness: awake  Airway and Oxygen Therapy: Patient Spontanous Breathing  Post-op Pain: mild  Post-op Assessment: Post-op Vital signs reviewed  Post-op Vital Signs: Reviewed  Last Vitals:  Filed Vitals:   01/19/14 1119  BP: 156/80  Pulse: 73  Temp: 36.3 C  Resp: 18    Complications: No apparent anesthesia complications

## 2014-01-19 NOTE — Progress Notes (Signed)
Utilization review completed.  

## 2014-01-19 NOTE — ED Provider Notes (Signed)
  This was a shared visit with a mid-level provided (NP or PA).  Throughout the patient's course I was available for consultation/collaboration.  I saw the ECG (paced, rate 61, abnormal), relevant labs and studies - I agree with the interpretation.  On my exam the patient was in no distress.  However, with concern for progression of disease, ongoing dyspnea, patient was admitted for further evaluation and management.      Carmin Muskrat, MD 01/19/14 [phone]

## 2014-01-20 DIAGNOSIS — E43 Unspecified severe protein-calorie malnutrition: Secondary | ICD-10-CM

## 2014-01-20 DIAGNOSIS — J441 Chronic obstructive pulmonary disease with (acute) exacerbation: Secondary | ICD-10-CM

## 2014-01-20 DIAGNOSIS — J9600 Acute respiratory failure, unspecified whether with hypoxia or hypercapnia: Secondary | ICD-10-CM

## 2014-01-20 MED ORDER — ALBUTEROL SULFATE HFA 108 (90 BASE) MCG/ACT IN AERS: 2.0000 | INHALATION_SPRAY | RESPIRATORY_TRACT | Status: AC | PRN

## 2014-01-20 MED ORDER — TIOTROPIUM BROMIDE MONOHYDRATE 18 MCG IN CAPS: 18.0000 ug | ORAL_CAPSULE | Freq: Every day | RESPIRATORY_TRACT | Status: AC

## 2014-01-20 MED ORDER — ENSURE COMPLETE PO LIQD: 237.0000 mL | Freq: Three times a day (TID) | ORAL | Status: AC

## 2014-01-20 MED ORDER — ALBUTEROL SULFATE (2.5 MG/3ML) 0.083% IN NEBU: 2.5000 mg | INHALATION_SOLUTION | Freq: Four times a day (QID) | RESPIRATORY_TRACT | Status: AC | PRN

## 2014-01-20 NOTE — Progress Notes (Signed)
01/20/14 Spoke with patient about HHC. He selected [facility]. Patient states that he lives alone but that his daughter will be staying with him at discharge and his son will be available to assist prn.Contacted Marie at [employer] and set up [facility]. Contacted [employer], spoke with Pam, faxed orders for home O2 @2l  and nebulizer, H and P and demographic sheet. Confirmed with Pam thaht fax received and they will deliver portable O2 to patient's room ASAP. Fuller Plan RN, BSN, CCM

## 2014-01-20 NOTE — Progress Notes (Signed)
Patient discharge teaching given, including activity, diet, follow-up appoints, and medications. Patient verbalized understanding of all discharge instructions. IV access was d/c'd. Vitals are stable. Skin is intact except as charted in most recent assessments. Pt to be escorted out by RN, to be driven home by family.  

## 2014-01-20 NOTE — Discharge Summary (Signed)
Physician Discharge Summary  [name] PYP:[id number] DOB: 1930/07/07 DOA: 01/18/2014  PCP: Cristopher Peru, MD  Admit date: 01/18/2014 Discharge date: 01/20/2014  Time spent: 60 minutes  Recommendations for Outpatient Follow-up:  1. Continue Spiriva, albuterol and nebs prn for COPD 2. Discharge on home oxygen 24/7 with Home Health services 3. Recheck CBC and BMP 4. Follow up with ENT Dr. Melene Plan in office in 1 week, Oncology at [facility] on 7/6  Discharge Diagnoses:  Active Problems:   PULMONARY HYPERTENSION   COPD   Anal cancer   Hypertension   Pacemaker   Atrial fibrillation   Neck mass   Protein-calorie malnutrition, severe   Discharge Condition: Stable  Diet recommendation: General, heart healthy  Filed Weights   01/18/14 1121 01/18/14 1736  Weight: 40.824 kg (90 lb) 50.984 kg (112 lb 6.4 oz)    History of present illness:  [name] is an 80 y.o. male with a past medical history of symptomatic bradycardia with [employer] pacemaker in place, HTN, tricuspid regurgitation, COPD, prostate cancer (2009), pulmonary HTN, rectal cancer (2009), and SOB sent to ED from his cardiologist, Dr. Cristopher Peru, to be admitted by hospitalist for further evaluation of SOB, productive cough, and evaluation of cancer due to significant weight loss over last few months. Pt is 6', weighing 90lb today. Pt reports having productive cough for several weeks, associated with a sore throat on left side with a "small mass" on left side of neck, and difficulty swallowing over last few weeks. Denies chest pain, fever, chills, n/v/d. Pt states he did use to smoke marijuana and occasionally cigarettes. Pt was placed on 2L Strong oxygen when he arrived to ED as his O2 was 84% on RA. Pt is not normally on O2 at home.   In the ED, CT of neck showed Interval growth of large mass in the left lateral oropharynx and base of the tongue. Necrotic lymph nodes on the left are more prominent. Right-sided adenopathy also slightly  more prominent. Findings are compatible with carcinoma with progression. Edema in the supraglottic airway, question tumor or therapy related.It is noted that pt had CT neck 10/2013 which showed mass as well but has not had a follow up biopsy for bx and treatment plan. He is admitted for further eval and management.   Hospital Course:   Active Problems:  Left oropharyngeal mass  - Likely malignancy, status post direct endoscopy with biopsy on 7/1.Biopsy results still pending at time of discharge. - Seen in consultation by ENT- Dr. Melene Plan, underwent bx on 7/1, pending results. Spoke with ENT, okay to discharge, they will follow up in the office in 1 week - Oncology followup at [facility] on 7/6 at 3 pm-patient and son aware of the importance to keep this appt.  COPD  - Not actively wheezing.  - Discharge with nebs, Spiriva, and home oxygen to be used 24/7 - Discharged with home health services to assist with home oxygen  Chronic Hypoxic Resp Failure -likely secondary to above -CTA Chest negative for PE.Will be discharged on Home O2  Chronic diastolic dysfunction  - Clinically compensated.  History of bradycardia and AV block  - Status post permanent pacemaker implantation.   History of pulmonary hypertension  - Continue outpatient follow-up   Protein-calorie malnutrition, severe  - Supplements per nutrition  Tobacco Abuse -counselled extensively. Have explained to patient and son, that he should NOT smoke while on home O2.  Procedures:  Endoscopy with biopsy (7/1)  Consultations:  ENT - Dr. Melene Plan  Discharge Exam: Filed Vitals:   01/20/14 1014  BP: 133/66  Pulse: 78  Temp:   Resp:    General: Well developed, thin-appearing male in NAD, appears stated age, pleasant  HEENT: PERR, EOMI, Anicteic Sclera, MMM, Oral lesions present with dental carries, slight R uvular deviation Neck: Supple, firm and mildly tender adenopathy in L cervical and submandibular area, normal  ROM Cardiovascular: RRR, S1 S2, no m/g/r, no wheezes or ronchi  Respiratory: CTAB with equal chest rise   Abdomen: Soft, nontender, nondistended, +BS  Extremities: warm, dry, without cyanosis or edema.  Neuro: AAOx3  Skin: Without rashes or exudates  Psych: Normal affect and demeanor with intact judgement and insight       Discharge Instructions   Call MD for:  difficulty breathing, headache or visual disturbances    Complete by:  As directed      Call MD for:  severe uncontrolled pain    Complete by:  As directed      Call MD for:  temperature >100.4    Complete by:  As directed      Diet - low sodium heart healthy    Complete by:  As directed      For home use only DME oxygen    Complete by:  As directed   Mode or (Route):  Nasal cannula  Liters per Minute:  2  Frequency:  Continuous (stationary and portable oxygen unit needed)  Oxygen conserving device:  Yes  Oxygen delivery system:  Gas     Increase activity slowly    Complete by:  As directed             Medication List         albuterol 108 (90 BASE) MCG/ACT inhaler  Commonly known as:  PROVENTIL HFA;VENTOLIN HFA  Inhale 2 puffs into the lungs every 4 (four) hours as needed for wheezing or shortness of breath.     albuterol (2.5 MG/3ML) 0.083% nebulizer solution  Commonly known as:  PROVENTIL  Take 3 mLs (2.5 mg total) by nebulization every 6 (six) hours as needed for wheezing or shortness of breath.     carvedilol 3.125 MG tablet  Commonly known as:  COREG  Take 1 tablet (3.125 mg total) by mouth 2 (two) times daily.     feeding supplement (ENSURE COMPLETE) Liqd  Take 237 mLs by mouth 3 (three) times daily between meals.     multivitamin with minerals Tabs tablet  Take 1 tablet by mouth daily.     tiotropium 18 MCG inhalation capsule  Commonly known as:  SPIRIVA  Place 1 capsule (18 mcg total) into inhaler and inhale daily.       No Known Allergies Follow-up Information   Follow up with Ascencion Dike, MD. Schedule an appointment as soon as possible for a visit in 1 week.   Specialty:  Otolaryngology   Contact information:   [address] [place] [address] [phone]       Follow up with [facility], NI, MD On 01/24/2014. (appt at 3 pm (appointment with Cancer Doctor))    Specialty:  Hematology and Oncology   Contact information:   [place] [address] [phone]       Follow up with [facility]. (They will call you to schedule a nurse visit.)    Contact information:   [address] [place] [address] [phone]        The  results of significant diagnostics from this hospitalization (including imaging, microbiology, ancillary and laboratory) are listed below for reference.    Significant Diagnostic Studies: Dg Chest 2 View  01/18/2014   CLINICAL DATA:  Chest pain and shortness of breath with history of previous pacemaker placement; history of COPD and pulmonary hypertension  EXAM: CHEST  2 VIEW  COMPARISON:  Chest x-ray of July 02, 2012.  FINDINGS: The lungs are hyperinflated. The heart is normal in size. The pulmonary vascularity is prominent centrally and stable. There is no pulmonary edema. The permanent pacemaker is normal in position. The bony thorax is unremarkable.  IMPRESSION: There is hyperinflation consistent with COPD. There is no evidence of CHF, pneumonia nor other acute cardiopulmonary abnormality.   Electronically Signed   By: David  Martinique   On: 01/18/2014 13:45   Ct Soft Tissue Neck W Contrast  01/18/2014   CLINICAL DATA:  Neck mass  EXAM: CT NECK WITH CONTRAST  TECHNIQUE: Multidetector CT imaging of the neck was performed using the standard protocol following the bolus administration of intravenous contrast.  CONTRAST:  75mL OMNIPAQUE IOHEXOL 300 MG/ML  SOLN  COMPARISON:  CT neck 11/01/2013  FINDINGS: Severe apical emphysema bilaterally with right apical scarring, stable.  Left arm injection of  contrast. Left innominate vein is occluded secondary to pacemaker. Contrast fills multiple collaterals in the posterior neck. Despite this there is adequate opacification of the neck veins and arteries.  Large necrotic mass in the base the tongue on the left. Assuming the patient has not has surgery, this is felt to be a necrotic tumor rather than a surgical cavity. The mass extends into the left lateral oropharynx were the mass measures approximately 20 x 27 mm. There appears to be interval growth of tumor. There is thickening of the soft palate on the left due to tumor extension.  Metastatic necrotic lymph nodes in the left neck are slightly larger. Left level 2 node measures 22 x 15 mm. Anterior and inferior to this is a 10 mm necrotic lymph nod. Left level 3 node on axial image 77 measures 7 mm but is necrotic and is consistent with tumor spread. This node was not necrotic previously.  Right level 2 node measures 9 mm and is unchanged. Right level 3 node on axial image 79 measures 9.3 mm and appears slightly larger. 9 mm supraclavicular node on the right is unchanged.  IMPRESSION: Interval growth of large mass in the left lateral oropharynx and base of the tongue. Necrotic lymph nodes on the left are more prominent. Right-sided adenopathy also slightly more prominent. Findings are compatible with carcinoma with progression.  Edema in the supraglottic airway, question tumor or therapy related. Correlate with direct visualization.  Severe apical emphysema bilaterally.   Electronically Signed   By: Franchot Gallo M.D.   On: 01/18/2014 15:56   Ct Angio Chest Pe W/cm &/or Wo Cm  01/19/2014   CLINICAL DATA:  Hypoxia.  EXAM: CT ANGIOGRAPHY CHEST WITH CONTRAST  TECHNIQUE: Multidetector CT imaging of the chest was performed using the standard protocol during bolus administration of intravenous contrast. Multiplanar CT image reconstructions and MIPs were obtained to evaluate the vascular anatomy.  CONTRAST:  44mL  OMNIPAQUE IOHEXOL 350 MG/ML SOLN  COMPARISON:  01/07/2011.  FINDINGS: No acute pulmonary emboli. No pathologically enlarged mediastinal lymph nodes. Bi hilar lymphoid tissue is unchanged. Pulmonary arteries are enlarged, as is the heart. No pericardial effusion.  Small bilateral pleural effusions. Moderately severe centrilobular emphysema with bullous changes at  the apices. Biapical pleural parenchymal scarring. An 8 x 12 mm nodular density seen in the apical right upper lobe (image 17, series 6), new from 01/07/2011. Dependent volume loss in the lower lobes bilaterally. Subpleural scarring in the lingula (image 53). Spiculated 9 mm nodule in the lingula (image 60), new. Slightly more inferiorly within the lingula is a 7 mm nodule (image 67), also new. A 7 mm nodule in the superior segment left lower lobe (image 34) is new. There may be an nodule in the right lower lobe as well, adjacent to atelectasis (series 6, image 71). Scattered calcified granulomas. Airway is unremarkable.  Incidental imaging of the upper abdomen shows the visualized portions of the liver, adrenal glands and right kidney to be grossly unremarkable. A 12 mm low-attenuation lesion in the left kidney is slightly smaller than on 01/07/2011 and likely a cyst. Visualized portions of the spleen and stomach are grossly unremarkable.  There is a new lucent lesion in the T10 vertebral body, measuring 1.7 x 2.0 cm.  Review of the MIP images confirms the above findings.  IMPRESSION: 1. Negative for acute pulmonary embolus. 2. Lingular and left lower lobe nodules are new. Possible right lower lobe nodule. Primary bronchogenic carcinoma and metastatic disease can both have this appearance. 3. New lytic lesion in the T10 vertebral body, most consistent with metastatic disease. 4. The above findings will be called to the ordering clinician or representative by the Radiologist Assistant, and communication documented in the PACS or zVision Dashboard. 5. 8 x 12  mm nodular density in the apical right upper lobe, new from 01/07/2011. Continued attention on followup exams is warranted. 6. Enlarged pulmonary arteries, indicative of pulmonary arterial hypertension. 7. Small bilateral effusions with mild volume loss in the lower lobes.   Electronically Signed   By: Lorin Picket M.D.   On: 01/19/2014 18:36    Microbiology: Recent Results (from the past 240 hour(s))  SURGICAL PCR SCREEN     Status: None   Collection Time    01/19/14  9:10 AM      Result Value Ref Range Status   MRSA, PCR NEGATIVE  NEGATIVE Final   Staphylococcus aureus NEGATIVE  NEGATIVE Final   Comment:            The Xpert SA Assay (FDA     approved for NASAL specimens     in patients over 25 years of age),     is one component of     a comprehensive surveillance     program.  Test performance has     been validated by [employer] for patients greater     than or equal to 67 year old.     It is not intended     to diagnose infection nor to     guide or monitor treatment.     Labs: Basic Metabolic Panel:  Recent Labs Lab 01/18/14 1204 01/19/14 0742  NA 146 144  K 3.6* 3.6*  CL 101 100  CO2 33* 32  GLUCOSE 83 71  BUN 12 10  CREATININE 0.84 0.71  CALCIUM 9.3 9.2   CBC:  Recent Labs Lab 01/18/14 1204 01/19/14 0830  WBC 8.2 11.2*  NEUTROABS 6.7  --   HGB 11.0* 9.8*  HCT 35.3* 30.9*  MCV 81.3 81.3  PLT 153 161   Cardiac Enzymes:  Recent Labs Lab 01/18/14 1204  TROPONINI <0.30     Signed:  Tammi Klippel, PA-S  [facility] 01/20/2014, 11:12 AM  Attending Patient was seen, examined,treatment plan was discussed with the Physician extender. I have directly reviewed the clinical findings, lab, imaging studies and management of this patient in detail. I have made the necessary changes to the above noted documentation, and agree with the documentation, as recorded by the Physician extender.  Nena Alexander MD [facility].

## 2014-01-24 ENCOUNTER — Telehealth: Payer: Self-pay | Admitting: Internal Medicine

## 2014-01-24 ENCOUNTER — Encounter (HOSPITAL_COMMUNITY): Payer: Self-pay | Admitting: Otolaryngology

## 2014-01-24 ENCOUNTER — Encounter: Payer: Self-pay | Admitting: Internal Medicine

## 2014-01-24 ENCOUNTER — Ambulatory Visit: Payer: Medicare HMO

## 2014-01-24 ENCOUNTER — Ambulatory Visit (HOSPITAL_BASED_OUTPATIENT_CLINIC_OR_DEPARTMENT_OTHER): Payer: Commercial Managed Care - HMO | Admitting: Internal Medicine

## 2014-01-24 ENCOUNTER — Ambulatory Visit: Payer: Commercial Managed Care - HMO

## 2014-01-24 VITALS — BP 153/79 | HR 86 | Temp 97.8°F | Resp 17 | Ht 72.0 in | Wt 117.2 lb

## 2014-01-24 DIAGNOSIS — Z87891 Personal history of nicotine dependence: Secondary | ICD-10-CM

## 2014-01-24 DIAGNOSIS — Z91199 Patient's noncompliance with other medical treatment and regimen due to unspecified reason: Secondary | ICD-10-CM

## 2014-01-24 DIAGNOSIS — Z8546 Personal history of malignant neoplasm of prostate: Secondary | ICD-10-CM

## 2014-01-24 DIAGNOSIS — IMO0002 Reserved for concepts with insufficient information to code with codable children: Secondary | ICD-10-CM

## 2014-01-24 DIAGNOSIS — C109 Malignant neoplasm of oropharynx, unspecified: Secondary | ICD-10-CM

## 2014-01-24 DIAGNOSIS — R221 Localized swelling, mass and lump, neck: Secondary | ICD-10-CM

## 2014-01-24 DIAGNOSIS — J449 Chronic obstructive pulmonary disease, unspecified: Secondary | ICD-10-CM

## 2014-01-24 DIAGNOSIS — R64 Cachexia: Secondary | ICD-10-CM

## 2014-01-24 DIAGNOSIS — C799 Secondary malignant neoplasm of unspecified site: Secondary | ICD-10-CM

## 2014-01-24 DIAGNOSIS — E43 Unspecified severe protein-calorie malnutrition: Secondary | ICD-10-CM

## 2014-01-24 DIAGNOSIS — I2789 Other specified pulmonary heart diseases: Secondary | ICD-10-CM

## 2014-01-24 DIAGNOSIS — Z85048 Personal history of other malignant neoplasm of rectum, rectosigmoid junction, and anus: Secondary | ICD-10-CM

## 2014-01-24 DIAGNOSIS — Z9119 Patient's noncompliance with other medical treatment and regimen: Secondary | ICD-10-CM

## 2014-01-24 MED ORDER — OXYCODONE HCL 5 MG PO TABS: 5.0000 mg | ORAL_TABLET | Freq: Three times a day (TID) | ORAL | Status: DC | PRN

## 2014-01-24 NOTE — Progress Notes (Signed)
Checked in new pt with no financial concerns. °

## 2014-01-24 NOTE — Telephone Encounter (Signed)
New Prob   Requesting to add a home health aide, OT, and a social work eval for pt. Please call.

## 2014-01-24 NOTE — Telephone Encounter (Signed)
CALLED PATIENT AND GAVE NEW TIME FOR NP APPT TODAY @ 12.

## 2014-01-24 NOTE — Telephone Encounter (Signed)
gv adn rpinted appt scehd and avs for pt for July...gv pt barium

## 2014-01-24 NOTE — Progress Notes (Signed)
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 male who is being referred to our office for further evaluation of his Stage IV carcinoma. He also is a former smoker (quit cigarettes less than one year ago).  He had COPD, HTN, Pulmonary HTN, bradycardia, AV block s/p pacemaker, history of Anal Cancer and prostate cancer.   He is accompanied by his daughter [name]) here from [place]    He was recently admitted for treatment of his SOB and wt loss on 01/18/2014 and discharged on 01/20/2014. His neck CT scan on 01/18/2014 showed Interval growth of a large mass in the left lateral oropharynx and base of the tongue. Necrotic lymph nodes were noted bilaterally. Findings were compatible with carcinoma. It should be noted that the pt had a CT neck in the ER in April, which showed the same mass. However, the patient did not f/u with his ENT referral.   In the ED O2 sat was found to be 84% and improved to the 90s after [place] O2 was applied. He underwent direct laryngoscopy with biopsy by Dr. Benjamine Mola on 01/19/2014 with pathology consistent with squamous cell carcinoma.    The pt relates that he has had intermittent SOB for a few years, and the past 2-3 mos been losing weight - per cards office note- about 30lb weight loss.  He admits to a cough, productive of phlegm, denies hemoptysis, CP, and no drooling. He also denies leg swelling, and no fevers. His sister died from breast cancer in her 40's.  His mother died of ovarian cancer.  He had a sister with cancer in his 5s.  His son had leukemia at 10 months and passed away.   PAST MEDICAL HISTORY:  has a past  medical history of Symptomatic bradycardia; Hypertension; Tricuspid regurgitation; COPD (chronic obstructive pulmonary disease); Aortic valve calcification; Pulmonary HTN; Diastolic dysfunction; Pacemaker; Ejection fraction; Chest discomfort; SOB (shortness of breath) (01/18/14); Prostate cancer (2009); Rectal cancer (04/2008); and Daily headache.     PAST SURGICAL HISTORY: Past Surgical History  Procedure Laterality Date  . Appendectomy  1957  . Insert / replace / remove pacemaker  09/2007    Medtronic dual-chamber pacemaker (model [id number]) in setting of sympomatic bradycardia and complete heart block. By Dr. Lovena Le Abrom [facility])  . Excisional hemorrhoidectomy  2009  . Prostate biopsy  04/2008  . Biopsy rectal  04/2008  . Direct laryngoscopy N/A 01/19/2014    Procedure: DIRECT LARYNGOSCOPY;  Surgeon: Ascencion Dike, MD;  Location: [place];  Service: ENT;  Laterality: N/A;  with biopsy     CURRENT MEDICATIONS: has a current medication list which includes the following prescription(s): albuterol, albuterol, carvedilol, feeding supplement (ensure complete), multivitamin with minerals, tiotropium, oxycodone, and polyethylene glycol powder.   ALLERGIES: Review of patient's allergies indicates no known allergies.   SOCIAL HISTORY:  reports that he has quit smoking. His smoking use included Pipe and Cigarettes. He smoked 0.00 packs per day for 64 years. He has never used smokeless tobacco. He reports that he drinks alcohol. He reports that he does not use illicit drugs.   FAMILY HISTORY: His sister died from breast cancer in her  49's.  His mother died of ovarian cancer.  He had a sister with cancer in his 70s.  His son had leukemia at 10 months and passed away.   LABORATORY DATA:  CBC    Component Value Date/Time   WBC 11.2* 01/19/2014 0830   WBC 4.7 02/11/2011 1335   RBC 3.80* 01/19/2014 0830   RBC 4.15* 02/11/2011 1335   HGB 9.8* 01/19/2014 0830   HGB 11.6* 02/11/2011 1335   HCT 30.9* 01/19/2014 0830    HCT 34.8* 02/11/2011 1335   PLT 161 01/19/2014 0830   PLT 103* 02/11/2011 1335   MCV 81.3 01/19/2014 0830   MCV 83.9 02/11/2011 1335   MCH 25.8* 01/19/2014 0830   MCH 28.1 02/11/2011 1335   MCHC 31.7 01/19/2014 0830   MCHC 33.5 02/11/2011 1335   RDW 15.9* 01/19/2014 0830   RDW 14.5 02/11/2011 1335   LYMPHSABS 0.8 01/18/2014 1204   LYMPHSABS 1.1 02/11/2011 1335   MONOABS 0.5 01/18/2014 1204   MONOABS 0.3 02/11/2011 1335   EOSABS 0.2 01/18/2014 1204   EOSABS 0.1 02/11/2011 1335   BASOSABS 0.0 01/18/2014 1204   BASOSABS 0.0 02/11/2011 1335    CMP     Component Value Date/Time   NA 144 01/19/2014 0742   NA 143 02/11/2011 1341   K 3.6* 01/19/2014 0742   K 4.2 02/11/2011 1341   CL 100 01/19/2014 0742   CL 99 02/11/2011 1341   CO2 32 01/19/2014 0742   CO2 29 02/11/2011 1341   GLUCOSE 71 01/19/2014 0742   GLUCOSE 85 02/11/2011 1341   BUN 10 01/19/2014 0742   BUN 9 02/11/2011 1341   CREATININE 0.71 01/19/2014 0742   CREATININE 1.0 02/11/2011 1341   CALCIUM 9.2 01/19/2014 0742   CALCIUM 9.3 02/11/2011 1341   PROT 7.0 02/11/2011 1341   PROT 7.7 01/07/2011 1124   ALBUMIN 3.9 01/07/2011 1124   AST 20 02/11/2011 1341   AST 32 01/07/2011 1124   ALT 12 02/11/2011 1341   ALT 17 01/07/2011 1124   ALKPHOS 45 02/11/2011 1341   ALKPHOS 59 01/07/2011 1124   BILITOT 0.50 02/11/2011 1341   BILITOT 0.2* 01/07/2011 1124   GFRNONAA 86* 01/19/2014 0742   GFRNONAA >60 01/14/2011 1623   GFRAA >90 01/19/2014 0742   GFRAA >60 01/14/2011 1623   RADIOGRAPHY: Dg Chest 2 View  01/18/2014   CLINICAL DATA:  Chest pain and shortness of breath with history of previous pacemaker placement; history of COPD and pulmonary hypertension  EXAM: CHEST  2 VIEW  COMPARISON:  Chest x-ray of July 02, 2012.  FINDINGS: The lungs are hyperinflated. The heart is normal in size. The pulmonary vascularity is prominent centrally and stable. There is no pulmonary edema. The permanent pacemaker is normal in position. The bony thorax is unremarkable.  IMPRESSION: There is  hyperinflation consistent with COPD. There is no evidence of CHF, pneumonia nor other acute cardiopulmonary abnormality.   Electronically Signed   By: Jacob Karp  Prince   On: 01/18/2014 13:45   Ct Soft Tissue Neck W Contrast (personally reviewed by me)  01/18/2014   CLINICAL DATA:  Neck mass  EXAM: CT NECK WITH CONTRAST  TECHNIQUE: Multidetector CT imaging of the neck was performed using the standard protocol following the bolus administration of intravenous contrast.  CONTRAST:  17mL OMNIPAQUE IOHEXOL 300 MG/ML  SOLN  COMPARISON:  CT neck 11/01/2013  FINDINGS: Severe apical emphysema bilaterally with right apical scarring, stable.  Left arm injection of contrast. Left innominate vein is  occluded secondary to pacemaker. Contrast fills multiple collaterals in the posterior neck. Despite this there is adequate opacification of the neck veins and arteries.  Large necrotic mass in the base the tongue on the left. Assuming the patient has not has surgery, this is felt to be a necrotic tumor rather than a surgical cavity. The mass extends into the left lateral oropharynx were the mass measures approximately 20 x 27 mm. There appears to be interval growth of tumor. There is thickening of the soft palate on the left due to tumor extension.  Metastatic necrotic lymph nodes in the left neck are slightly larger. Left level 2 node measures 22 x 15 mm. Anterior and inferior to this is a 10 mm necrotic lymph nod. Left level 3 node on axial image 77 measures 7 mm but is necrotic and is consistent with tumor spread. This node was not necrotic previously.  Right level 2 node measures 9 mm and is unchanged. Right level 3 node on axial image 79 measures 9.3 mm and appears slightly larger. 9 mm supraclavicular node on the right is unchanged.  IMPRESSION: Interval growth of large mass in the left lateral oropharynx and base of the tongue. Necrotic lymph nodes on the left are more prominent. Right-sided adenopathy also slightly more  prominent. Findings are compatible with carcinoma with progression.  Edema in the supraglottic airway, question tumor or therapy related. Correlate with direct visualization.  Severe apical emphysema bilaterally.   Electronically Signed   By: Franchot Gallo M.D.   On: 01/18/2014 15:56   Ct Angio Chest Pe W/cm &/or Wo Cm (personally reviewed by me)  01/19/2014   CLINICAL DATA:  Hypoxia.  EXAM: CT ANGIOGRAPHY CHEST WITH CONTRAST  TECHNIQUE: Multidetector CT imaging of the chest was performed using the standard protocol during bolus administration of intravenous contrast. Multiplanar CT image reconstructions and MIPs were obtained to evaluate the vascular anatomy.  CONTRAST:  57mL OMNIPAQUE IOHEXOL 350 MG/ML SOLN  COMPARISON:  01/07/2011.  FINDINGS: No acute pulmonary emboli. No pathologically enlarged mediastinal lymph nodes. Bi hilar lymphoid tissue is unchanged. Pulmonary arteries are enlarged, as is the heart. No pericardial effusion.  Small bilateral pleural effusions. Moderately severe centrilobular emphysema with bullous changes at the apices. Biapical pleural parenchymal scarring. An 8 x 12 mm nodular density seen in the apical right upper lobe (image 17, series 6), new from 01/07/2011. Dependent volume loss in the lower lobes bilaterally. Subpleural scarring in the lingula (image 53). Spiculated 9 mm nodule in the lingula (image 60), new. Slightly more inferiorly within the lingula is a 7 mm nodule (image 67), also new. A 7 mm nodule in the superior segment left lower lobe (image 34) is new. There may be an nodule in the right lower lobe as well, adjacent to atelectasis (series 6, image 71). Scattered calcified granulomas. Airway is unremarkable.  Incidental imaging of the upper abdomen shows the visualized portions of the liver, adrenal glands and right kidney to be grossly unremarkable. A 12 mm low-attenuation lesion in the left kidney is slightly smaller than on 01/07/2011 and likely a cyst. Visualized  portions of the spleen and stomach are grossly unremarkable.  There is a new lucent lesion in the T10 vertebral body, measuring 1.7 x 2.0 cm.  Review of the MIP images confirms the above findings.  IMPRESSION: 1. Negative for acute pulmonary embolus. 2. Lingular and left lower lobe nodules are new. Possible right lower lobe nodule. Primary bronchogenic carcinoma and metastatic disease can both have  this appearance. 3. New lytic lesion in the T10 vertebral body, most consistent with metastatic disease. 4. The above findings will be called to the ordering clinician or representative by the Radiologist Assistant, and communication documented in the PACS or zVision Dashboard. 5. 8 x 12 mm nodular density in the apical right upper lobe, new from 01/07/2011. Continued attention on followup exams is warranted. 6. Enlarged pulmonary arteries, indicative of pulmonary arterial hypertension. 7. Small bilateral effusions with mild volume loss in the lower lobes.   Electronically Signed   By: Lorin Picket M.D.   On: 01/19/2014 18:36   Pathology: The invasive carcinoma demonstrates NEGATIVE p16 immunostain expression. (CR:kh 01-24-14) [place] RUND DO Pathologist, Electronic Signature ( Signed 01/24/2014) FINAL DIAGNOSIS Diagnosis Oropharynx, biopsy, left mass - INVASIVE SQUAMOUS CELL CARCINOMA SEE COMMENT. Microscopic Comment p16 immunostain is pending and will be reported in an addendum. The case is reviewed with Dr. Lyndon Code who concurs. The case was discussed with [name] on 01/20/14. (CRR:gt, 01/20/14) [place] RUND DO Pathologist, Electronic Signature (Case signed 01/20/2014)     REVIEW OF SYSTEMS:  Constitutional: Denies fevers, chills; Positive 30 lb weight loss Eyes: Denies blurriness of vision Ears, nose, mouth, throat, and face: Denies mucositis or sore throat Respiratory: Denies cough, dyspnea or wheezes Cardiovascular: Denies palpitation, chest discomfort or lower extremity swelling Gastrointestinal:  Denies  nausea, heartburn or change in bowel habits Skin: Denies abnormal skin rashes Lymphatics: Reports increasing lymphadenopathy in his neck; but denies easy bruising Neurological:Denies numbness, tingling or new weaknesses Behavioral/Psych: Mood is stable, no new changes  All other systems were reviewed with the patient and are negative.  PHYSICAL EXAM:  height is 6' (1.829 m) and weight is 117 lb 3.2 oz (53.162 kg). His oral temperature is 97.8 F (36.6 C). His blood pressure is 153/79 and his pulse is 86. His respiration is 17 and oxygen saturation is 100%.   On 2 liters of oxygen by nasal cannula.  ECOG 2/3:  GENERAL:alert, no distress and comfortable; On oxygen with notable profound cachexia. HOH.  SKIN: skin color, texture, turgor are normal, no rashes or significant lesions EYES: normal, Conjunctiva are pink and non-injected, sclera clear OROPHARYNX:no exudate, no erythema and lips, buccal mucosa, and tongue normal ;mass on left soft palate, slight deviation of uvula to right. Not erythematous. No exudates. No uvula swelling. Not characteristic of tonsillar abscess.   NECK: supple, thyroid normal size, non-tender, without nodularity LYMPH:  Multiple 1-2 cm palpable lymphadenopathy in the left cervical area; no palpable axillary or inguinal LUNGS: clear to auscultation and percussion with normal breathing effort HEART: regular rate & rhythm and no murmurs and no lower extremity edema ABDOMEN:abdomen soft, non-tender and normal bowel sounds Musculoskeletal:no cyanosis of digits and no clubbing  NEURO: alert & oriented x 3 with fluent speech, no focal motor/sensory deficits   IMPRESSION: [name] is a 80 y.o. male with a history of   PLAN:  1.  Stage IV invasive SCC with probable metastases to the bone, lung.   --We had a detailed discussion regarding the results of the CT scan of Neck and Chest.  These images were personally reviewed by me with the patient.  We then discussed the  results of his biopsy consistent with invasive squamous cell carcinoma with probable lung metastases and bone metastases.  We discussed that his cancer is not likely curable given evidence of probable metastases on recent scans.  We also noted that we could not exclude a prior lung cancer given the  radiologist's evaluation without a biopsy.  Given its location and evidence of moderate severe COPD, the risk of any lung biopsy would be high and we recommended against it.  The patient reported receiving a prior history of chemotherapy plus radiation.  We discussed potential options would be chemotherapy versus radiation verus best supportive care for palliation of his cancer.  He denied pain presently.   Patient did not desire chemotherapy due to his current functional status and nutrition.   We agreed with a hospice referral based on his life expectancy with best supportive care alone will be less than six months.  We then made a hospice referral to discuss hospice at home.   We reminded patient that hospice will focus on QOL and comfort measures.  We provided him a prescription of oxycodone 5 mg q 12 hours as needed for pain and discomfort.  To complete staging for additional evidence of disease, we will obtain a CT of abdomen with contrast.       2.  ECOG 2/3. --His functional status is borderline/poor with multiple co-morbidities as noted below.  He is unlikely to tolerate chemotherapy.  See above.   3.  Cachexia with poor nutrition. --Patient will continue ensure with meals tid.   4. Moderate severe COPD on oxygen.  --Continue albuterol 2 puffs into the lung every 4 hours.    5. Hypertension. --Continue carvedilol 3.125 mg bid.    6. Pulmonary Hypertension.  7. AV block s/p pacemaker.  8. History of Anal cancer.  9. History of Prostate cancer.   10. Decreased hearing.   11. Chronic diastolic heart failure.   12. History of medical noncompliance.   All questions were answered. The  patient knows to call the clinic with any problems, questions or concerns. We can certainly see the patient much sooner if necessary.  I spent 40 minutes counseling the patient face to face. The total time spent in the appointment was 60 minutes.    Jermal Dismuke, MD 01/27/2014 1:53 PM

## 2014-01-27 ENCOUNTER — Other Ambulatory Visit: Payer: Self-pay | Admitting: Medical Oncology

## 2014-01-27 MED ORDER — POLYETHYLENE GLYCOL 3350 17 GM/SCOOP PO POWD: ORAL | Status: AC

## 2014-01-27 MED ORDER — OXYCODONE HCL 5 MG PO TABS: ORAL_TABLET | ORAL | Status: DC

## 2014-01-28 ENCOUNTER — Ambulatory Visit (HOSPITAL_COMMUNITY): Payer: Medicare PPO

## 2014-02-08 ENCOUNTER — Telehealth: Payer: Self-pay | Admitting: Internal Medicine

## 2014-02-08 ENCOUNTER — Ambulatory Visit (HOSPITAL_BASED_OUTPATIENT_CLINIC_OR_DEPARTMENT_OTHER): Payer: Commercial Managed Care - HMO | Admitting: Internal Medicine

## 2014-02-08 ENCOUNTER — Encounter: Payer: Self-pay | Admitting: *Deleted

## 2014-02-08 VITALS — BP 136/80 | HR 86 | Temp 98.3°F | Resp 19 | Ht 72.0 in | Wt 116.1 lb

## 2014-02-08 DIAGNOSIS — R9430 Abnormal result of cardiovascular function study, unspecified: Secondary | ICD-10-CM

## 2014-02-08 DIAGNOSIS — I359 Nonrheumatic aortic valve disorder, unspecified: Secondary | ICD-10-CM

## 2014-02-08 DIAGNOSIS — IMO0002 Reserved for concepts with insufficient information to code with codable children: Secondary | ICD-10-CM | POA: Insufficient documentation

## 2014-02-08 DIAGNOSIS — R221 Localized swelling, mass and lump, neck: Secondary | ICD-10-CM

## 2014-02-08 DIAGNOSIS — I5189 Other ill-defined heart diseases: Secondary | ICD-10-CM

## 2014-02-08 DIAGNOSIS — R944 Abnormal results of kidney function studies: Secondary | ICD-10-CM

## 2014-02-08 DIAGNOSIS — J449 Chronic obstructive pulmonary disease, unspecified: Secondary | ICD-10-CM

## 2014-02-08 DIAGNOSIS — I2789 Other specified pulmonary heart diseases: Secondary | ICD-10-CM

## 2014-02-08 DIAGNOSIS — E43 Unspecified severe protein-calorie malnutrition: Secondary | ICD-10-CM

## 2014-02-08 DIAGNOSIS — Z85048 Personal history of other malignant neoplasm of rectum, rectosigmoid junction, and anus: Secondary | ICD-10-CM

## 2014-02-08 DIAGNOSIS — C109 Malignant neoplasm of oropharynx, unspecified: Secondary | ICD-10-CM

## 2014-02-08 DIAGNOSIS — Z8546 Personal history of malignant neoplasm of prostate: Secondary | ICD-10-CM

## 2014-02-08 DIAGNOSIS — M542 Cervicalgia: Secondary | ICD-10-CM

## 2014-02-08 DIAGNOSIS — I4891 Unspecified atrial fibrillation: Secondary | ICD-10-CM

## 2014-02-08 DIAGNOSIS — I5032 Chronic diastolic (congestive) heart failure: Secondary | ICD-10-CM

## 2014-02-08 DIAGNOSIS — I10 Essential (primary) hypertension: Secondary | ICD-10-CM

## 2014-02-08 DIAGNOSIS — R220 Localized swelling, mass and lump, head: Secondary | ICD-10-CM

## 2014-02-08 DIAGNOSIS — C799 Secondary malignant neoplasm of unspecified site: Secondary | ICD-10-CM

## 2014-02-08 DIAGNOSIS — Z950 Presence of cardiac pacemaker: Secondary | ICD-10-CM

## 2014-02-08 MED ORDER — OXYCODONE HCL 10 MG PO TABS: 10.0000 mg | ORAL_TABLET | Freq: Three times a day (TID) | ORAL | Status: AC | PRN

## 2014-02-08 NOTE — Progress Notes (Signed)
[facility] OFFICE PROGRESS NOTE  Cristopher Peru, MD [phone] [address] [place] [address]  DIAGNOSIS: Metastatic squamous cell carcinoma - Plan: Oxycodone HCl 10 MG TABS, Ambulatory referral to Radiation Oncology, Ambulatory referral to Social Work  Protein-calorie malnutrition, severe  Pacemaker  Neck mass  Ejection fraction  Diastolic dysfunction  COPD  Atrial fibrillation, unspecified  Aortic valve calcification  AORTIC INSUFFICIENCY  Chief Complaint  Patient presents with  . Follow-up    CURRENT TREATMENT: Hospice/ comfort care since 01/26/2014.   INTERVAL HISTORY: [name] 80 y.o. male who was referred to our office for further evaluation of his Stage IV carcinoma and seen initially on 01/24/2014. He was referred to hospice and returns for follow up with his daughter.  He reports severe pain to his back and left neck/face area upon awakening each day.  He states that the oxycodone 5 mg helps but that his pain is still 7 out of 10.   He reports diminished hearing on the left side.  He reports anorexia.    ONCOLOGY HISTORY: He also is a former smoker (quit cigarettes less than one year ago). He had COPD, HTN, Pulmonary HTN, bradycardia, AV block s/p pacemaker, history of Anal Cancer and prostate cancer. He is accompanied by his daughter [name]) here from [place].  He was recently admitted for treatment of his SOB and wt loss on 01/18/2014 and discharged on 01/20/2014. His neck CT scan on 01/18/2014 showed Interval growth of a large mass in the left lateral oropharynx and base of the tongue. Necrotic lymph nodes were noted bilaterally. Findings were compatible with carcinoma. It should be noted that the pt had a CT neck in the ER in April, which showed the same mass. However, the patient did not f/u with his ENT referral.  In the ED O2 sat was found to be 84% and improved to the 90s after Delia O2 was applied. He underwent direct  laryngoscopy with biopsy by Dr. Benjamine Mola on 01/19/2014 with pathology consistent with squamous cell carcinoma.  The pt relates that he has had intermittent SOB for a few years, and the past 2-3 mos been losing weight - per cards office note- about 30lb weight loss. He admits to a cough, productive of phlegm, denies hemoptysis, CP, and no drooling. He also denies leg swelling, and no fevers. His sister died from breast cancer in her 43's. His mother died of ovarian cancer. He had a sister with cancer in his 82s. His son had leukemia at 10 months and passed away.   MEDICAL HISTORY: Past Medical History  Diagnosis Date  . Symptomatic bradycardia     and complete heart block - s/p dual chamber Medtronic pacemaker insertion (model [id number]) - followed by [facility]  . Hypertension   . Tricuspid regurgitation     mild/moderate  . COPD (chronic obstructive pulmonary disease)   . Aortic valve calcification   . Pulmonary HTN      PA pressure stable echo, June, 2012, 49 mmHg  moderate - with PA peak pressure 49 mmHg (per 2-D echo 12/2010)  . Diastolic dysfunction     grade 1 diastolic dysfunction per 2-D echo (12/2010) , normal LV systolic function with EF 55-60%  . Pacemaker     Bradycardia and AV block, Dr. Lovena Le  . Ejection fraction     Normal, echo, 2009, 57%, nuclear, 2009, / EF 55-60%, echo, June, 2012  . Chest discomfort     Nuclear, 2009, no  ischemia  . SOB (shortness of breath) 01/18/14  . Prostate cancer 2009    followed by Dr. Jamse Arn, pt reports he is s/p radiation/ chemotherapy (pending records)  . Rectal cancer 04/2008    rectal mass biopsy showing invasive, poorly differentiated squamous cell carcinoma  . Daily headache     "recently" (01/18/2014)    INTERIM HISTORY: has TRICUSPID REGURGITATION; PULMONARY HYPERTENSION; AORTIC INSUFFICIENCY; COPD; Anal cancer; Diastolic dysfunction; Preventative health care; Anemia; Hypertension; Tricuspid regurgitation; Aortic valve calcification;  Pacemaker; Chest discomfort; Pulmonary HTN; Ejection fraction; Electromechanical dissociation (EMD) with heart block; Acute on chronic diastolic congestive heart failure; Dyspnea on exertion; Atrial fibrillation; Neck mass; Acute respiratory failure; Protein-calorie malnutrition, severe; and Metastatic squamous cell carcinoma on his problem list.    ALLERGIES:  has No Known Allergies.  MEDICATIONS: has a current medication list which includes the following prescription(s): albuterol, albuterol, carvedilol, feeding supplement (ensure complete), multivitamin with minerals, polyethylene glycol powder, tiotropium, and oxycodone hcl.  SURGICAL HISTORY:  Past Surgical History  Procedure Laterality Date  . Appendectomy  1957  . Insert / replace / remove pacemaker  09/2007    Medtronic dual-chamber pacemaker (model [id number]) in setting of sympomatic bradycardia and complete heart block. By Dr. Lovena Le [facility])  . Excisional hemorrhoidectomy  2009  . Prostate biopsy  04/2008  . Biopsy rectal  04/2008  . Direct laryngoscopy N/A 01/19/2014    Procedure: DIRECT LARYNGOSCOPY;  Surgeon: Ascencion Dike, MD;  Location: [facility];  Service: ENT;  Laterality: N/A;  with biopsy    REVIEW OF SYSTEMS:   Constitutional: Denies fevers, chills or abnormal weight loss Eyes: Denies blurriness of vision Ears, nose, mouth, throat, and face: Denies mucositis or sore throat Respiratory: Denies cough, dyspnea or wheezes Cardiovascular: Denies palpitation, chest discomfort or lower extremity swelling Gastrointestinal:  Denies nausea, heartburn or change in bowel habits Skin: Denies abnormal skin rashes Lymphatics: Denies new lymphadenopathy or easy bruising Neurological:Denies numbness, tingling or new weaknesses Behavioral/Psych: Mood is stable, no new changes  All other systems were reviewed with the patient and are negative.  PHYSICAL EXAMINATION: ECOG PERFORMANCE STATUS: 3 - Symptomatic, >50% confined to bed  Blood  pressure 136/80, pulse 86, temperature 98.3 F (36.8 C), temperature source Oral, resp. rate 19, height 6' (1.829 m), weight 116 lb 1.6 oz (52.663 kg), SpO2 94.00%.  On 2 liters of oxygen by nasal cannula.  GENERAL:alert, no distress and comfortable; On oxygen with notable profound cachexia. HOH.  SKIN: skin color, texture, turgor are normal, no rashes or significant lesions  EYES: normal, Conjunctiva are pink and non-injected, sclera clear  OROPHARYNX:no exudate, no erythema and lips, buccal mucosa, and tongue normal ;mass on left soft palate, slight deviation of uvula to right. Not erythematous. No exudates. No uvula swelling. Not characteristic of tonsillar abscess.  NECK: supple, thyroid normal size, non-tender, without nodularity  LYMPH: Multiple 1-2 cm palpable lymphadenopathy in the left cervical area; no palpable axillary or inguinal  LUNGS: clear to auscultation and percussion with normal breathing effort  HEART: regular rate & rhythm and no murmurs and no lower extremity edema  ABDOMEN:abdomen soft, non-tender and normal bowel sounds  Musculoskeletal:no cyanosis of digits and no clubbing  NEURO: alert & oriented x 3 with fluent speech, no focal motor/sensory deficits    Labs:  Lab Results  Component Value Date   WBC 11.2* 01/19/2014   HGB 9.8* 01/19/2014   HCT 30.9* 01/19/2014   MCV 81.3 01/19/2014   PLT 161 01/19/2014   NEUTROABS  6.7 01/18/2014      Chemistry      Component Value Date/Time   NA 144 01/19/2014 0742   NA 143 02/11/2011 1341   K 3.6* 01/19/2014 0742   K 4.2 02/11/2011 1341   CL 100 01/19/2014 0742   CL 99 02/11/2011 1341   CO2 32 01/19/2014 0742   CO2 29 02/11/2011 1341   BUN 10 01/19/2014 0742   BUN 9 02/11/2011 1341   CREATININE 0.71 01/19/2014 0742   CREATININE 1.0 02/11/2011 1341      Component Value Date/Time   CALCIUM 9.2 01/19/2014 0742   CALCIUM 9.3 02/11/2011 1341   ALKPHOS 45 02/11/2011 1341   ALKPHOS 59 01/07/2011 1124   AST 20 02/11/2011 1341   AST 32 01/07/2011 1124    ALT 12 02/11/2011 1341   ALT 17 01/07/2011 1124   BILITOT 0.50 02/11/2011 1341   BILITOT 0.2* 01/07/2011 1124     RADIOGRAPHIC STUDIES: Dg Chest 2 View  01/18/2014   CLINICAL DATA:  Chest pain and shortness of breath with history of previous pacemaker placement; history of COPD and pulmonary hypertension  EXAM: CHEST  2 VIEW  COMPARISON:  Chest x-ray of July 02, 2012.  FINDINGS: The lungs are hyperinflated. The heart is normal in size. The pulmonary vascularity is prominent centrally and stable. There is no pulmonary edema. The permanent pacemaker is normal in position. The bony thorax is unremarkable.  IMPRESSION: There is hyperinflation consistent with COPD. There is no evidence of CHF, pneumonia nor other acute cardiopulmonary abnormality.   Electronically Signed   By: Jessey Huyett  [place]   On: 01/18/2014 13:45   Ct Soft Tissue Neck W Contrast  01/18/2014   CLINICAL DATA:  Neck mass  EXAM: CT NECK WITH CONTRAST  TECHNIQUE: Multidetector CT imaging of the neck was performed using the standard protocol following the bolus administration of intravenous contrast.  CONTRAST:  69mL OMNIPAQUE IOHEXOL 300 MG/ML  SOLN  COMPARISON:  CT neck 11/01/2013  FINDINGS: Severe apical emphysema bilaterally with right apical scarring, stable.  Left arm injection of contrast. Left innominate vein is occluded secondary to pacemaker. Contrast fills multiple collaterals in the posterior neck. Despite this there is adequate opacification of the neck veins and arteries.  Large necrotic mass in the base the tongue on the left. Assuming the patient has not has surgery, this is felt to be a necrotic tumor rather than a surgical cavity. The mass extends into the left lateral oropharynx were the mass measures approximately 20 x 27 mm. There appears to be interval growth of tumor. There is thickening of the soft palate on the left due to tumor extension.  Metastatic necrotic lymph nodes in the left neck are slightly larger. Left level 2  node measures 22 x 15 mm. Anterior and inferior to this is a 10 mm necrotic lymph nod. Left level 3 node on axial image 77 measures 7 mm but is necrotic and is consistent with tumor spread. This node was not necrotic previously.  Right level 2 node measures 9 mm and is unchanged. Right level 3 node on axial image 79 measures 9.3 mm and appears slightly larger. 9 mm supraclavicular node on the right is unchanged.  IMPRESSION: Interval growth of large mass in the left lateral oropharynx and base of the tongue. Necrotic lymph nodes on the left are more prominent. Right-sided adenopathy also slightly more prominent. Findings are compatible with carcinoma with progression.  Edema in the supraglottic airway, question tumor or therapy related.  Correlate with direct visualization.  Severe apical emphysema bilaterally.   Electronically Signed   By: Franchot Gallo M.D.   On: 01/18/2014 15:56   Ct Angio Chest Pe W/cm &/or Wo Cm  01/19/2014   CLINICAL DATA:  Hypoxia.  EXAM: CT ANGIOGRAPHY CHEST WITH CONTRAST  TECHNIQUE: Multidetector CT imaging of the chest was performed using the standard protocol during bolus administration of intravenous contrast. Multiplanar CT image reconstructions and MIPs were obtained to evaluate the vascular anatomy.  CONTRAST:  6mL OMNIPAQUE IOHEXOL 350 MG/ML SOLN  COMPARISON:  01/07/2011.  FINDINGS: No acute pulmonary emboli. No pathologically enlarged mediastinal lymph nodes. Bi hilar lymphoid tissue is unchanged. Pulmonary arteries are enlarged, as is the heart. No pericardial effusion.  Small bilateral pleural effusions. Moderately severe centrilobular emphysema with bullous changes at the apices. Biapical pleural parenchymal scarring. An 8 x 12 mm nodular density seen in the apical right upper lobe (image 17, series 6), new from 01/07/2011. Dependent volume loss in the lower lobes bilaterally. Subpleural scarring in the lingula (image 53). Spiculated 9 mm nodule in the lingula (image 60), new.  Slightly more inferiorly within the lingula is a 7 mm nodule (image 67), also new. A 7 mm nodule in the superior segment left lower lobe (image 34) is new. There may be an nodule in the right lower lobe as well, adjacent to atelectasis (series 6, image 71). Scattered calcified granulomas. Airway is unremarkable.  Incidental imaging of the upper abdomen shows the visualized portions of the liver, adrenal glands and right kidney to be grossly unremarkable. A 12 mm low-attenuation lesion in the left kidney is slightly smaller than on 01/07/2011 and likely a cyst. Visualized portions of the spleen and stomach are grossly unremarkable.  There is a new lucent lesion in the T10 vertebral body, measuring 1.7 x 2.0 cm.  Review of the MIP images confirms the above findings.  IMPRESSION: 1. Negative for acute pulmonary embolus. 2. Lingular and left lower lobe nodules are new. Possible right lower lobe nodule. Primary bronchogenic carcinoma and metastatic disease can both have this appearance. 3. New lytic lesion in the T10 vertebral body, most consistent with metastatic disease. 4. The above findings will be called to the ordering clinician or representative by the Radiologist Assistant, and communication documented in the PACS or zVision Dashboard. 5. 8 x 12 mm nodular density in the apical right upper lobe, new from 01/07/2011. Continued attention on followup exams is warranted. 6. Enlarged pulmonary arteries, indicative of pulmonary arterial hypertension. 7. Small bilateral effusions with mild volume loss in the lower lobes.   Electronically Signed   By: Lorin Picket M.D.   On: 01/19/2014 18:36    ASSESSMENT: [name] 80 y.o. male with a history of Metastatic squamous cell carcinoma - Plan: Oxycodone HCl 10 MG TABS, Ambulatory referral to Radiation Oncology, Ambulatory referral to Social Work  Protein-calorie malnutrition, severe  Pacemaker  Neck mass  Ejection fraction  Diastolic  dysfunction  COPD  Atrial fibrillation, unspecified  Aortic valve calcification  AORTIC INSUFFICIENCY   PLAN:   1. Stage IV invasive SCC with probable metastases to the bone, lung.  --Last visit, we had a detailed discussion regarding the results of the CT scan of Neck and Chest. We also discussed the results of his biopsy consistent with invasive squamous cell carcinoma with probable lung metastases and bone metastases. We discussed that his cancer is not likely curable given evidence of probable metastases on recent scans. We also noted that we  could not exclude a prior lung cancer given the radiologist's evaluation without a biopsy. Given its location and evidence of moderate severe COPD, the risk of any lung biopsy would be high and we recommended against it. The patient reported receiving a prior history of chemotherapy plus radiation. We discussed potential options would be chemotherapy versus radiation verus best supportive care for palliation of his cancer. He denied pain. Patient did not desire chemotherapy due to his current functional status and nutrition. We agreed with a hospice referral based on his life expectancy with best supportive care alone will be less than six months. We then made a hospice referral to discuss hospice at home.   --He is now in hospice at home.  However, his pain is only mildly controlled with oxycodone 5 mg q 6-8 hours.  We will therefore increase it to oxycodone 10 mg q 8 hours (#60) provided today.   In addition, he will be referred to radiation oncology for consideration of a short course of radiation to his left neck and other areas that cause him pain.  He was agreeable to seeing radiation oncology.  The daughter requested a handicap sticker which was filled out in our office today.   In addition, we made a referral to social work.    2. ECOG 3.  --His functional status is poor with multiple co-morbidities as noted below. He is unlikely to tolerate  chemotherapy. See above.   3. Cachexia with poor nutrition.  --Patient will continue ensure with meals tid.   4. Moderate severe COPD on oxygen.  --Continue albuterol 2 puffs into the lung every 4 hours.   5. Hypertension.  --Continue carvedilol 3.125 mg bid.   6. Pulmonary Hypertension.  7. AV block s/p pacemaker.  8. History of Anal cancer.  9. History of Prostate cancer.  10. Decreased hearing.  11. Chronic diastolic heart failure.  12. History of medical noncompliance.   All questions were answered. The patient knows to call the clinic with any problems, questions or concerns. We can certainly see the patient much sooner if necessary.  I spent 15 minutes counseling the patient face to face. The total time spent in the appointment was 25 minutes.    Kasyn Rolph, MD 02/08/2014 1:17 PM

## 2014-02-08 NOTE — Progress Notes (Signed)
[place] Work  Clinical Social Work was referred by Futures trader for assessment of psychosocial needs.  Clinical Social Worker contacted patient at home to offer support and assess for needs. Mr. Bohanon states he is "doing just fine", stated he took pain medication one hour ago and is experiencing very little pain at this time.  Patient had difficulty hearing CSW over phone.  CSW arranged to meet with patient after radiation oncology consult.   Polo Riley, MSW, LCSW, OSW-C Clinical Social Worker [facility] [phone]

## 2014-02-08 NOTE — Telephone Encounter (Signed)
gv adn printed appt sched and av sfo rpt for July.Marland KitchenMarland KitchenMarland KitchenPt sched to see Dr. Lisbeth Renshaw on 7.27 @ 8:30am////emailed Ander Purpura for social work appt

## 2014-02-11 NOTE — Progress Notes (Signed)
Head and Neck Cancer Location of Tumor / Histology:Invasive squamous cell cancer of oropharynx  Patient presented months ago with symptoms of:Mass revealed on ct of neck in April but patient did not follow up with ENT referral.  Biopsies of 01/19/2014  Oropharynx, biopsy, left mass - INVASIVE SQUAMOUS CELL CARCINOMA SEE COMMENT.  Nutrition Status:  Weight changes:30 lbs over last 3 months.   Swallowing status:  Plans, if any, for PEG tube:no  Tobacco/Marijuana/Snuff/ETOH ZDG:UYQI smoking less than one year ago.   Past/Anticipated interventions by otolaryngology, if any:no  Past/Anticipated interventions by medical oncology, if [id number] not desired secondary to health status.Patient is under care of hospice. Patient had concurrent chemo/radiation in 2009 by Dr.Odogwu and Dr.Wentworth.  Referrals yet, to any of the following?  Social Work? Called lauren mullis today, yes  Dentistry? no  Swallowing therapy?   Nutrition? poor  Med/Onc? no  PEG placement no  SAFETY ISSUES:  Prior radiation?concurrent radiation of anus 54 Gy completed 07/25/2008 directed by Dr.Wentworth  Pacemaker/ICD? Yes  Possible current pregnancy? No  Is the patient on methotrexate?No  Current Complaints / other details: Married.Patient is being referred for radiation , rechecked oxygen level  On room air was 86%,patient brught his o2 tank, placed on 2 liters, rechecked =100%, rr even unlabored, no c/o pain at present, has trouble swallowing  NKDA Multiple co-mordities   Co-morbidities Multiple co-morbidities History of anal cancer and prostate cancer in 2009.

## 2014-02-14 ENCOUNTER — Telehealth: Payer: Self-pay | Admitting: *Deleted

## 2014-02-14 ENCOUNTER — Ambulatory Visit
Admission: RE | Admit: 2014-02-14 | Discharge: 2014-02-14 | Disposition: A | Discharge status: Home / Self Care | Payer: Medicare PPO | Source: Ambulatory Visit | Attending: Radiation Oncology | Admitting: Radiation Oncology

## 2014-02-14 ENCOUNTER — Encounter: Payer: Self-pay | Admitting: Radiation Oncology

## 2014-02-14 ENCOUNTER — Encounter: Payer: Self-pay | Admitting: Nutrition

## 2014-02-14 ENCOUNTER — Encounter: Payer: Self-pay | Admitting: *Deleted

## 2014-02-14 VITALS — BP 106/63 | HR 80 | Temp 98.2°F | Resp 22 | Ht 72.0 in | Wt 114.2 lb

## 2014-02-14 DIAGNOSIS — I10 Essential (primary) hypertension: Secondary | ICD-10-CM | POA: Insufficient documentation

## 2014-02-14 DIAGNOSIS — J449 Chronic obstructive pulmonary disease, unspecified: Secondary | ICD-10-CM | POA: Diagnosis not present

## 2014-02-14 DIAGNOSIS — C210 Malignant neoplasm of anus, unspecified: Secondary | ICD-10-CM

## 2014-02-14 DIAGNOSIS — IMO0002 Reserved for concepts with insufficient information to code with codable children: Secondary | ICD-10-CM

## 2014-02-14 DIAGNOSIS — M899 Disorder of bone, unspecified: Secondary | ICD-10-CM | POA: Insufficient documentation

## 2014-02-14 DIAGNOSIS — J4489 Other specified chronic obstructive pulmonary disease: Secondary | ICD-10-CM | POA: Insufficient documentation

## 2014-02-14 DIAGNOSIS — C799 Secondary malignant neoplasm of unspecified site: Secondary | ICD-10-CM

## 2014-02-14 DIAGNOSIS — Z85048 Personal history of other malignant neoplasm of rectum, rectosigmoid junction, and anus: Secondary | ICD-10-CM | POA: Diagnosis not present

## 2014-02-14 DIAGNOSIS — Z9221 Personal history of antineoplastic chemotherapy: Secondary | ICD-10-CM | POA: Diagnosis not present

## 2014-02-14 DIAGNOSIS — Z510 Encounter for antineoplastic radiation therapy: Secondary | ICD-10-CM | POA: Insufficient documentation

## 2014-02-14 DIAGNOSIS — Z87891 Personal history of nicotine dependence: Secondary | ICD-10-CM | POA: Insufficient documentation

## 2014-02-14 DIAGNOSIS — M949 Disorder of cartilage, unspecified: Secondary | ICD-10-CM

## 2014-02-14 DIAGNOSIS — C779 Secondary and unspecified malignant neoplasm of lymph node, unspecified: Secondary | ICD-10-CM

## 2014-02-14 DIAGNOSIS — R918 Other nonspecific abnormal finding of lung field: Secondary | ICD-10-CM | POA: Diagnosis not present

## 2014-02-14 DIAGNOSIS — C50919 Malignant neoplasm of unspecified site of unspecified female breast: Secondary | ICD-10-CM | POA: Insufficient documentation

## 2014-02-14 DIAGNOSIS — Z950 Presence of cardiac pacemaker: Secondary | ICD-10-CM | POA: Diagnosis not present

## 2014-02-14 DIAGNOSIS — D490 Neoplasm of unspecified behavior of digestive system: Secondary | ICD-10-CM

## 2014-02-14 HISTORY — DX: Malignant (primary) neoplasm, unspecified: C80.1

## 2014-02-14 NOTE — Telephone Encounter (Signed)
Called patient home, left voice message to call if have forgotten 830 am appt today,, if forgot appt, please call [phone] to reschedule or if on his way will see shortly 8:47 AM  8:47 AM

## 2014-02-14 NOTE — Progress Notes (Signed)
Contacted patient and spoke with his daughter.  Patient would like Ensure Plus samples.  I will provide first Complementary case of Ensure Plus.  Daughter very appreciative and reports she will pick it up on Tuesday, July 28.

## 2014-02-14 NOTE — Progress Notes (Signed)
Jacob Psychosocial Distress Screening Clinical Social Work  Clinical Social Work was referred by distress screening protocol.  The patient scored a 9 on the Psychosocial Distress Thermometer which indicates severe distress. Clinical Social Worker met with patient and daughter in radiation oncology to assess for distress and other psychosocial needs. [name] stated he was doing well and his only concern was to "get better every week and to follow the doctors instructions".  [name], [name], was tearful when asked her concerns.  [name] stated she is not from this area and is "just concerned that he gets the resources he needs".  MD entered exam room at this time- CSW will follow up with patient/family at a later time to provide emotional support.  Patient is currently enrolled in Hospice.  CSW and patient/family discussed Hospice services- they will provide assistance with bathing 2x week, however, patient felt he is able to ambulate and function independently at this time.  CSW left voicemail for patient's Hospice CSW- [name] to collaborate and assist in providing support to patient and family.  Patient indicated food as a concern on distress screen- [name] states he is able to eat soft foods, but has decreased appetite.  He stated he enjoyed nutritional supplements, but is unable to purchase them at this time.  CSW made referral to [name] program and to [name], dietitian.  ONCBCN DISTRESS SCREENING 02/14/2014  Screening Type Initial Screening  Mark the number that describes how much distress you have been experiencing in the past week 9  Practical problem type Food  Emotional problem type Adjusting to illness  Physical Problem type Bathing/dressing  Physician notified of physical symptoms Yes  Referral to clinical social work Yes    Clinical Social Worker follow up needed: Yes.    If yes, follow up plan:  Will follow up once received contact from [place].  [name], MSW, LCSW, OSW-C Clinical Social Worker [facility] [phone]

## 2014-02-14 NOTE — Progress Notes (Signed)
Please see the Nurse Progress Note in the MD Initial Consult Encounter for this patient. 

## 2014-02-15 NOTE — Progress Notes (Signed)
Radiation Oncology         [phone] ________________________________  Name: [name] MRN: [id number]  Date: 02/14/2014  DOB: 1930-02-16  TI:[id number] Lovena Le, MD  Concha Norway, MD     REFERRING PHYSICIAN: Concha Norway, MD   DIAGNOSIS: The primary encounter diagnosis was Metastatic squamous cell carcinoma. A diagnosis of Anal cancer was also pertinent to this visit.   HISTORY OF PRESENT ILLNESS::[name] is a 80 y.o. male who is seen for an initial consultation visit. 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 I have been asked to see the patient today by Dr. Juliann Mule who initially saw the patient earlier this month. The patient recently was admitted for some shortness of breath and weight loss. The patient underwent a neck CT scan which revealed a left lateral oropharyngeal/base of tongue mass with associated necrotic lymph nodes bilaterally. This was felt to be consistent with malignancy. Notably, the patient had similar findings on a CT scan of the neck in the emergency room in April but the patient did not followup with the recommended referral to ENT. Patient underwent direct laryngoscopy with a biopsy and this did return positive for invasive invasive squamous cell carcinoma. Further workup has included a CT angiogram. This showed pulmonary nodules consistent with metastatic disease in addition to a lytic lesion within the T12 vertebral body. The patient therefore was diagnosed as having stage IV disease.  The patient saw Dr. Lorelle Formosa in medical oncology and his possible treatment options were reviewed. The decision was made for the patient to proceed with hospice. However, the patient has subsequently been seen by medical oncology complaining of significant left-sided throat and neck pain. This has been poorly controlled with medication and therefore I have been asked to see the patient  today for consideration of a short course of palliative treatment to the head and neck region.   PREVIOUS RADIATION THERAPY: Yes the patient has a history of squamous cell carcinoma of the anal canal and is status post chemotherapy radiation treatment in our clinic from 2000. He remains clinically NED for this disease.   PAST MEDICAL HISTORY:  has a past medical history of Symptomatic bradycardia; Hypertension; Tricuspid regurgitation; COPD (chronic obstructive pulmonary disease); Aortic valve calcification; Pulmonary HTN; Diastolic dysfunction; Pacemaker; Ejection fraction; Chest discomfort; SOB (shortness of breath) (01/18/14); Prostate cancer (2009); Rectal cancer (04/2008); Daily headache; and Cancer (01/19/14).     PAST SURGICAL HISTORY: Past Surgical History  Procedure Laterality Date  . Appendectomy  1957  . Insert / replace / remove pacemaker  09/2007    Medtronic dual-chamber pacemaker (model [id number]) in setting of sympomatic bradycardia and complete heart block. By Dr. Lovena Le [facility])  . Excisional hemorrhoidectomy  2009  . Prostate biopsy  04/2008  . Biopsy rectal  04/2008  . Direct laryngoscopy N/A 01/19/2014    Procedure: DIRECT LARYNGOSCOPY;  Surgeon: Ascencion Dike, MD;  Location: [facility] OR;  Service: ENT;  Laterality: N/A;  with biopsy     FAMILY HISTORY: family history is not on file.   SOCIAL HISTORY:  reports that he has quit smoking. His smoking use included Pipe and Cigarettes. He smoked 0.00 packs per day for 64 years. He has never used smokeless tobacco. He reports that he drinks alcohol. He reports that he does not use illicit drugs.   ALLERGIES: Review of patient's allergies indicates no known allergies.   MEDICATIONS:  Current  Outpatient Prescriptions  Medication Sig Dispense Refill  . albuterol (PROVENTIL HFA;VENTOLIN HFA) 108 (90 BASE) MCG/ACT inhaler Inhale 2 puffs into the lungs every 4 (four) hours as needed for wheezing or shortness of breath.  1 Inhaler  0    . albuterol (PROVENTIL) (2.5 MG/3ML) 0.083% nebulizer solution Take 3 mLs (2.5 mg total) by nebulization every 6 (six) hours as needed for wheezing or shortness of breath.  75 mL  12  . carvedilol (COREG) 3.125 MG tablet Take 1 tablet (3.125 mg total) by mouth 2 (two) times daily.  180 tablet  3  . Oxycodone HCl 10 MG TABS Take 1 tablet (10 mg total) by mouth every 8 (eight) hours as needed.  90 tablet  0  . tiotropium (SPIRIVA) 18 MCG inhalation capsule Place 1 capsule (18 mcg total) into inhaler and inhale daily.  30 capsule  0  . feeding supplement, ENSURE COMPLETE, (ENSURE COMPLETE) LIQD Take 237 mLs by mouth 3 (three) times daily between meals.  90 Bottle  0  . Multiple Vitamin (MULTIVITAMIN WITH MINERALS) TABS tablet Take 1 tablet by mouth daily.      . polyethylene glycol powder (MIRALAX) powder Use as directed  255 g  0   No current facility-administered medications for this encounter.     REVIEW OF SYSTEMS:  A 15 point review of systems is documented in the electronic medical record. This was obtained by the nursing staff. However, I reviewed this with the patient to discuss relevant findings and make appropriate changes.  Pertinent items are noted in HPI.    PHYSICAL EXAM:  height is 6' (1.829 m) and weight is 114 lb 3.2 oz (51.801 kg). His oral temperature is 98.2 F (36.8 C). His blood pressure is 106/63 and his pulse is 80. His respiration is 22 and oxygen saturation is 86%.   ECOG = 2  0 - Asymptomatic (Fully active, able to carry on all predisease activities without restriction)  1 - Symptomatic but completely ambulatory (Restricted in physically strenuous activity but ambulatory and able to carry out work of a light or sedentary nature. For example, light housework, office work)  2 - Symptomatic, <50% in bed during the day (Ambulatory and capable of all self care but unable to carry out any work activities. Up and about more than 50% of waking hours)  3 - Symptomatic, >50%  in bed, but not bedbound (Capable of only limited self-care, confined to bed or chair 50% or more of waking hours)  4 - Bedbound (Completely disabled. Cannot carry on any self-care. Totally confined to bed or chair)  5 - Death   Eustace Pen MM, Creech RH, Tormey DC, et al. [phone]). "Toxicity and response criteria of the [facility]". [place] Oncol. 5 (6): 649-55  General: Well-developed, in no acute distress HEENT: Normocephalic, atraumatic, oral cavity clear Neck:  Bilateral upper cervical lymphadenopathy is present Cardiovascular: Regular rate and rhythm Respiratory: Clear to auscultation bilaterally GI: Soft, nontender, normal bowel sounds Extremities: No edema present   LABORATORY DATA:  Lab Results  Component Value Date   WBC 11.2* 01/19/2014   HGB 9.8* 01/19/2014   HCT 30.9* 01/19/2014   MCV 81.3 01/19/2014   PLT 161 01/19/2014   Lab Results  Component Value Date   NA 144 01/19/2014   K 3.6* 01/19/2014   CL 100 01/19/2014   CO2 32 01/19/2014   Lab Results  Component Value Date   ALT 12 02/11/2011   AST 20  02/11/2011   ALKPHOS 45 02/11/2011   BILITOT 0.50 02/11/2011      RADIOGRAPHY: Dg Chest 2 View  01/18/2014   CLINICAL DATA:  Chest pain and shortness of breath with history of previous pacemaker placement; history of COPD and pulmonary hypertension  EXAM: CHEST  2 VIEW  COMPARISON:  Chest x-ray of July 02, 2012.  FINDINGS: The lungs are hyperinflated. The heart is normal in size. The pulmonary vascularity is prominent centrally and stable. There is no pulmonary edema. The permanent pacemaker is normal in position. The bony thorax is unremarkable.  IMPRESSION: There is hyperinflation consistent with COPD. There is no evidence of CHF, pneumonia nor other acute cardiopulmonary abnormality.   Electronically Signed   By: David  Martinique   On: 01/18/2014 13:45   Ct Soft Tissue Neck W Contrast  01/18/2014   CLINICAL DATA:  Neck mass  EXAM: CT NECK WITH CONTRAST   TECHNIQUE: Multidetector CT imaging of the neck was performed using the standard protocol following the bolus administration of intravenous contrast.  CONTRAST:  80mL OMNIPAQUE IOHEXOL 300 MG/ML  SOLN  COMPARISON:  CT neck 11/01/2013  FINDINGS: Severe apical emphysema bilaterally with right apical scarring, stable.  Left arm injection of contrast. Left innominate vein is occluded secondary to pacemaker. Contrast fills multiple collaterals in the posterior neck. Despite this there is adequate opacification of the neck veins and arteries.  Large necrotic mass in the base the tongue on the left. Assuming the patient has not has surgery, this is felt to be a necrotic tumor rather than a surgical cavity. The mass extends into the left lateral oropharynx were the mass measures approximately 20 x 27 mm. There appears to be interval growth of tumor. There is thickening of the soft palate on the left due to tumor extension.  Metastatic necrotic lymph nodes in the left neck are slightly larger. Left level 2 node measures 22 x 15 mm. Anterior and inferior to this is a 10 mm necrotic lymph nod. Left level 3 node on axial image 77 measures 7 mm but is necrotic and is consistent with tumor spread. This node was not necrotic previously.  Right level 2 node measures 9 mm and is unchanged. Right level 3 node on axial image 79 measures 9.3 mm and appears slightly larger. 9 mm supraclavicular node on the right is unchanged.  IMPRESSION: Interval growth of large mass in the left lateral oropharynx and base of the tongue. Necrotic lymph nodes on the left are more prominent. Right-sided adenopathy also slightly more prominent. Findings are compatible with carcinoma with progression.  Edema in the supraglottic airway, question tumor or therapy related. Correlate with direct visualization.  Severe apical emphysema bilaterally.   Electronically Signed   By: Franchot Gallo M.D.   On: 01/18/2014 15:56   Ct Angio Chest Pe W/cm &/or Wo  Cm  01/19/2014   CLINICAL DATA:  Hypoxia.  EXAM: CT ANGIOGRAPHY CHEST WITH CONTRAST  TECHNIQUE: Multidetector CT imaging of the chest was performed using the standard protocol during bolus administration of intravenous contrast. Multiplanar CT image reconstructions and MIPs were obtained to evaluate the vascular anatomy.  CONTRAST:  40mL OMNIPAQUE IOHEXOL 350 MG/ML SOLN  COMPARISON:  01/07/2011.  FINDINGS: No acute pulmonary emboli. No pathologically enlarged mediastinal lymph nodes. Bi hilar lymphoid tissue is unchanged. Pulmonary arteries are enlarged, as is the heart. No pericardial effusion.  Small bilateral pleural effusions. Moderately severe centrilobular emphysema with bullous changes at the apices. Biapical pleural parenchymal scarring. An  8 x 12 mm nodular density seen in the apical right upper lobe (image 17, series 6), new from 01/07/2011. Dependent volume loss in the lower lobes bilaterally. Subpleural scarring in the lingula (image 53). Spiculated 9 mm nodule in the lingula (image 60), new. Slightly more inferiorly within the lingula is a 7 mm nodule (image 67), also new. A 7 mm nodule in the superior segment left lower lobe (image 34) is new. There may be an nodule in the right lower lobe as well, adjacent to atelectasis (series 6, image 71). Scattered calcified granulomas. Airway is unremarkable.  Incidental imaging of the upper abdomen shows the visualized portions of the liver, adrenal glands and right kidney to be grossly unremarkable. A 12 mm low-attenuation lesion in the left kidney is slightly smaller than on 01/07/2011 and likely a cyst. Visualized portions of the spleen and stomach are grossly unremarkable.  There is a new lucent lesion in the T10 vertebral body, measuring 1.7 x 2.0 cm.  Review of the MIP images confirms the above findings.  IMPRESSION: 1. Negative for acute pulmonary embolus. 2. Lingular and left lower lobe nodules are new. Possible right lower lobe nodule. Primary  bronchogenic carcinoma and metastatic disease can both have this appearance. 3. New lytic lesion in the T10 vertebral body, most consistent with metastatic disease. 4. The above findings will be called to the ordering clinician or representative by the Radiologist Assistant, and communication documented in the PACS or zVision Dashboard. 5. 8 x 12 mm nodular density in the apical right upper lobe, new from 01/07/2011. Continued attention on followup exams is warranted. 6. Enlarged pulmonary arteries, indicative of pulmonary arterial hypertension. 7. Small bilateral effusions with mild volume loss in the lower lobes.   Electronically Signed   By: Lorin Picket M.D.   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 He describes some significant pain in the throat/left neck region. He has been enrolled in hospice. His pain is currently poorly controlled at this time.  I believe that the patient is an appropriate candidate for a short course of palliative radiation treatment to the left neck/oropharynx. I discussed the possible benefits and rationale of this treatment with the patient. I also discuss with him the possible side effects and risks which I believe most prominently would include mucositis and possible dry mouth.  We discussed the timeframe of treatment and I would anticipate a 2 week course of treatment.   PLAN: The patient indicated that he wishes to proceed with his treatment. Therefore he will be scheduled for a simulation in the near future such that we can proceed with treatment planning.    I spent 60 minutes face to face with the patient and more than 50% of that time was spent in counseling and/or coordination of care.    ________________________________   Jodelle Gross, MD, PhD   **Disclaimer: This note was dictated with voice recognition software. Similar sounding words can inadvertently be  transcribed and this note may contain transcription errors which may not have been corrected upon publication of note.**

## 2014-02-17 ENCOUNTER — Telehealth: Payer: Self-pay | Admitting: *Deleted

## 2014-02-17 NOTE — Telephone Encounter (Signed)
Called Dr.Greg Lovena Le office at [place],[phone], left voice message in [place] phone that I had faxed pacemaker form to Fax [phone], and if that isn't correct please call us and give correct fax number, Dr.Taylor needs to fill out form,sign and fax back to [place] Asap please, pateint to start radiation soon with Dr.Moody 2:27 PM

## 2014-02-18 ENCOUNTER — Ambulatory Visit
Admission: RE | Admit: 2014-02-18 | Discharge: 2014-02-18 | Disposition: A | Discharge status: Home / Self Care | Payer: Medicare PPO | Source: Ambulatory Visit | Attending: Radiation Oncology | Admitting: Radiation Oncology

## 2014-02-18 DIAGNOSIS — Z510 Encounter for antineoplastic radiation therapy: Secondary | ICD-10-CM | POA: Diagnosis not present

## 2014-02-18 DIAGNOSIS — IMO0002 Reserved for concepts with insufficient information to code with codable children: Secondary | ICD-10-CM

## 2014-02-18 DIAGNOSIS — C799 Secondary malignant neoplasm of unspecified site: Secondary | ICD-10-CM

## 2014-02-19 ENCOUNTER — Other Ambulatory Visit: Payer: Self-pay | Admitting: Hematology and Oncology

## 2014-02-25 ENCOUNTER — Ambulatory Visit
Admission: RE | Admit: 2014-02-25 | Discharge: 2014-02-25 | Disposition: A | Discharge status: Home / Self Care | Payer: Medicare PPO | Source: Ambulatory Visit | Attending: Radiation Oncology | Admitting: Radiation Oncology

## 2014-02-25 DIAGNOSIS — IMO0002 Reserved for concepts with insufficient information to code with codable children: Secondary | ICD-10-CM

## 2014-02-25 DIAGNOSIS — Z510 Encounter for antineoplastic radiation therapy: Secondary | ICD-10-CM | POA: Diagnosis not present

## 2014-02-25 DIAGNOSIS — C799 Secondary malignant neoplasm of unspecified site: Secondary | ICD-10-CM

## 2014-02-28 ENCOUNTER — Ambulatory Visit
Admission: RE | Admit: 2014-02-28 | Discharge: 2014-02-28 | Disposition: A | Discharge status: Home / Self Care | Payer: Medicare PPO | Source: Ambulatory Visit | Attending: Radiation Oncology | Admitting: Radiation Oncology

## 2014-02-28 ENCOUNTER — Encounter: Payer: Self-pay | Admitting: Radiation Oncology

## 2014-02-28 DIAGNOSIS — R221 Localized swelling, mass and lump, neck: Secondary | ICD-10-CM

## 2014-02-28 DIAGNOSIS — Z510 Encounter for antineoplastic radiation therapy: Secondary | ICD-10-CM | POA: Diagnosis not present

## 2014-02-28 MED ORDER — BIAFINE EX EMUL
Freq: Every day | CUTANEOUS | Status: DC
  Administered 2014-02-28: 12:00:00 via TOPICAL

## 2014-02-28 NOTE — Progress Notes (Signed)
Simulation Verification Note  The patient was brought to the treatment unit and placed in the planned treatment position. The clinical setup was verified. Then port films were obtained and uploaded to the radiation oncology medical record software.  The treatment beams were carefully compared against the planned radiation fields. The position location and shape of the radiation fields was reviewed. They targeted volume of tissue appears to be appropriately covered by the radiation beams. Organs at risk appear to be excluded as planned.  Based on my personal review, I approved the simulation verification. The patient's treatment will proceed as planned.  -----------------------------------  Cheyrl Buley, MD  

## 2014-02-28 NOTE — Progress Notes (Signed)
Patient education, biafine cream, my business card and radiation therapy and you book given to patient,discussed side effects, fatigue,skin irritation, mouth changes, nausea,thick rope-like saliva, hearing changes possible, , increase protein in diet, pain, may need to eat 5-6 smaller meals, brat diet,soft diet, difficulty swallowing, , all questions answered, but will need to review each visit, patient just kept saying yes mamam" 11:55 AM

## 2014-03-01 ENCOUNTER — Ambulatory Visit
Admission: RE | Admit: 2014-03-01 | Discharge: 2014-03-01 | Disposition: A | Discharge status: Home / Self Care | Payer: Medicare PPO | Source: Ambulatory Visit | Attending: Radiation Oncology | Admitting: Radiation Oncology

## 2014-03-01 DIAGNOSIS — Z510 Encounter for antineoplastic radiation therapy: Secondary | ICD-10-CM | POA: Diagnosis not present

## 2014-03-02 ENCOUNTER — Ambulatory Visit
Admission: RE | Admit: 2014-03-02 | Discharge: 2014-03-02 | Disposition: A | Discharge status: Home / Self Care | Payer: Medicare PPO | Source: Ambulatory Visit | Attending: Radiation Oncology | Admitting: Radiation Oncology

## 2014-03-02 DIAGNOSIS — Z510 Encounter for antineoplastic radiation therapy: Secondary | ICD-10-CM | POA: Diagnosis not present

## 2014-03-03 ENCOUNTER — Ambulatory Visit
Admission: RE | Admit: 2014-03-03 | Discharge: 2014-03-03 | Disposition: A | Discharge status: Home / Self Care | Payer: Medicare PPO | Source: Ambulatory Visit | Attending: Radiation Oncology | Admitting: Radiation Oncology

## 2014-03-03 DIAGNOSIS — Z510 Encounter for antineoplastic radiation therapy: Secondary | ICD-10-CM | POA: Diagnosis not present

## 2014-03-04 ENCOUNTER — Ambulatory Visit
Admission: RE | Admit: 2014-03-04 | Discharge: 2014-03-04 | Disposition: A | Discharge status: Home / Self Care | Payer: Medicare PPO | Source: Ambulatory Visit | Attending: Radiation Oncology | Admitting: Radiation Oncology

## 2014-03-04 VITALS — BP 108/56 | HR 68 | Temp 97.7°F | Resp 8 | Wt 110.8 lb

## 2014-03-04 DIAGNOSIS — Z510 Encounter for antineoplastic radiation therapy: Secondary | ICD-10-CM | POA: Diagnosis not present

## 2014-03-04 DIAGNOSIS — C799 Secondary malignant neoplasm of unspecified site: Secondary | ICD-10-CM

## 2014-03-04 DIAGNOSIS — IMO0002 Reserved for concepts with insufficient information to code with codable children: Secondary | ICD-10-CM

## 2014-03-04 NOTE — Progress Notes (Signed)
  Radiation Oncology         [phone]) [phone] ________________________________  Name: [name] MRN: [id number]  Date: 02/18/2014  DOB: Oct 12, 1929  SIMULATION AND TREATMENT PLANNING NOTE  DIAGNOSIS:  Squamous cell carcinoma of the oropharynx  Site:  Oral cavity/oropharynx/left neck  NARRATIVE:  The patient was brought to the [place].  Identity was confirmed.  All relevant records and images related to the planned course of therapy were reviewed.   Written consent to proceed with treatment was confirmed which was freely given after reviewing the details related to the planned course of therapy had been reviewed with the patient.  Then, the patient was set-up in a stable reproducible  supine position for radiation therapy.  CT images were obtained.  Surface markings were placed.    Medically necessary complex treatment device(s) for immobilization:  Customized thermoplastic head cast.   The CT images were loaded into the planning software.  Then the target and avoidance structures were contoured.  Treatment planning then occurred.  The radiation prescription was entered and confirmed.  A total of 4 complex treatment devices were fabricated which relate to the designed radiation treatment fields. Each of these customized fields/ complex treatment devices will be used on a daily basis during the radiation course. I have requested : 3D Simulation  I have requested a DVH of the following structures: Target, oral cavity, spinal cord.   The patient will undergo daily image guidance to ensure accurate localization of the target, and adequate minimize dose to the normal surrounding structures in close proximity to the target.   PLAN:  The patient will receive 30 Gy in 10 fractions.  ________________________________   Jodelle Gross, MD, PhD

## 2014-03-04 NOTE — Progress Notes (Signed)
   Department of Radiation Oncology  Phone:  [phone] Fax:        [phone]  Weekly Treatment Note    Name: [name] Date: 03/04/2014 MRN: [id number] DOB: [birth year]   Current dose: 15 Gy  Current fraction: 5   MEDICATIONS: Current Outpatient Prescriptions  Medication Sig Dispense Refill  . albuterol (PROVENTIL HFA;VENTOLIN HFA) 108 (90 BASE) MCG/ACT inhaler Inhale 2 puffs into the lungs every 4 (four) hours as needed for wheezing or shortness of breath.  1 Inhaler  0  . albuterol (PROVENTIL) (2.5 MG/3ML) 0.083% nebulizer solution Take 3 mLs (2.5 mg total) by nebulization every 6 (six) hours as needed for wheezing or shortness of breath.  75 mL  12  . carvedilol (COREG) 3.125 MG tablet Take 1 tablet (3.125 mg total) by mouth 2 (two) times daily.  180 tablet  3  . emollient (BIAFINE) cream Apply 1 application topically daily. Apply to affected treated area after rad  tx daily      . feeding supplement, ENSURE COMPLETE, (ENSURE COMPLETE) LIQD Take 237 mLs by mouth 3 (three) times daily between meals.  90 Bottle  0  . Multiple Vitamin (MULTIVITAMIN WITH MINERALS) TABS tablet Take 1 tablet by mouth daily.      . Oxycodone HCl 10 MG TABS Take 1 tablet (10 mg total) by mouth every 8 (eight) hours as needed.  90 tablet  0  . polyethylene glycol powder (MIRALAX) powder Use as directed  255 g  0  . tiotropium (SPIRIVA) 18 MCG inhalation capsule Place 1 capsule (18 mcg total) into inhaler and inhale daily.  30 capsule  0   No current facility-administered medications for this encounter.     ALLERGIES: Review of patient's allergies indicates no known allergies.   LABORATORY DATA:  Lab Results  Component Value Date   WBC 11.2* 01/19/2014   HGB 9.8* 01/19/2014   HCT 30.9* 01/19/2014   MCV 81.3 01/19/2014   PLT 161 01/19/2014   Lab Results  Component Value Date   NA 144 01/19/2014   K 3.6* 01/19/2014   CL 100 01/19/2014   CO2 32 01/19/2014   Lab Results  Component Value Date   ALT  12 02/11/2011   AST 20 02/11/2011   ALKPHOS 45 02/11/2011   BILITOT 0.50 02/11/2011     NARRATIVE: [name] was seen today for weekly treatment management. The chart was checked and the patient's films were reviewed. The patient states he has done fine with radiation treatment during his first week. He denies any increasing discomfort in the neck/oral cavity. No skin difficulties.  PHYSICAL EXAMINATION: weight is 110 lb 12.8 oz (50.259 kg). His oral temperature is 97.7 F (36.5 C). His blood pressure is 108/56 and his pulse is 68. His respiration is 8 and oxygen saturation is 95%.      no significant skin change currently within the left neck  ASSESSMENT: The patient is doing satisfactorily with treatment.  PLAN: We will continue with the patient's radiation treatment as planned.

## 2014-03-04 NOTE — Progress Notes (Signed)
He rates his pain as a 6 on a scale of 0-10. Pt complains of sore throat, Fatigue, Generalized Weakness and Poor Appetite.  Pt presenting slurred, garbled speech. Pt reports headaches- throbbing pain. Pt has had dysphagia for solids. Oral exam reveals mucous membranes moist, pharynx normal without lesions, large amount of white exudate, encouraged brushing teeth and tongue. Skin dry and intact.

## 2014-03-07 ENCOUNTER — Ambulatory Visit
Admission: RE | Admit: 2014-03-07 | Discharge: 2014-03-07 | Disposition: A | Discharge status: Home / Self Care | Payer: Medicare PPO | Source: Ambulatory Visit | Attending: Radiation Oncology | Admitting: Radiation Oncology

## 2014-03-07 ENCOUNTER — Encounter: Payer: Self-pay | Admitting: Nutrition

## 2014-03-07 DIAGNOSIS — Z510 Encounter for antineoplastic radiation therapy: Secondary | ICD-10-CM | POA: Diagnosis not present

## 2014-03-07 NOTE — Progress Notes (Signed)
Received call from patient's daughter in [place] requesting 2nd case of Ensure Plus for patient.  I will leave at front desk for patient to pick up today when he comes for his appointment.

## 2014-03-08 ENCOUNTER — Ambulatory Visit
Admission: RE | Admit: 2014-03-08 | Discharge: 2014-03-08 | Disposition: A | Discharge status: Home / Self Care | Payer: Medicare PPO | Source: Ambulatory Visit | Attending: Radiation Oncology | Admitting: Radiation Oncology

## 2014-03-08 DIAGNOSIS — Z510 Encounter for antineoplastic radiation therapy: Secondary | ICD-10-CM | POA: Diagnosis not present

## 2014-03-09 ENCOUNTER — Ambulatory Visit
Admission: RE | Admit: 2014-03-09 | Discharge: 2014-03-09 | Disposition: A | Discharge status: Home / Self Care | Payer: Medicare PPO | Source: Ambulatory Visit | Attending: Radiation Oncology | Admitting: Radiation Oncology

## 2014-03-09 VITALS — Wt 108.5 lb

## 2014-03-09 DIAGNOSIS — Z510 Encounter for antineoplastic radiation therapy: Secondary | ICD-10-CM | POA: Diagnosis not present

## 2014-03-09 DIAGNOSIS — C799 Secondary malignant neoplasm of unspecified site: Secondary | ICD-10-CM

## 2014-03-09 DIAGNOSIS — IMO0002 Reserved for concepts with insufficient information to code with codable children: Secondary | ICD-10-CM

## 2014-03-09 NOTE — Progress Notes (Signed)
Weekly rad txs  Left neck 8/10 completd, no skin changes noted, uses biafine cream daily, some difficulty swallowing certain foods, sore throat at times, ,had egg sandwich today drank water, 1/2 cn ensure,  Mostly 3 cans daily, said left rib area pain at times, takes his pain medication, last taken yesterday, no pain today stated 11:47 AM

## 2014-03-09 NOTE — Progress Notes (Signed)
   Department of Radiation Oncology  Phone:  [phone] Fax:        [phone]  Weekly Treatment Note    Name: [name] Date: 03/09/2014 MRN: [id number] DOB: 1929-07-28   Current dose: 24 Gy  Current fraction: 8   MEDICATIONS: Current Outpatient Prescriptions  Medication Sig Dispense Refill  . albuterol (PROVENTIL HFA;VENTOLIN HFA) 108 (90 BASE) MCG/ACT inhaler Inhale 2 puffs into the lungs every 4 (four) hours as needed for wheezing or shortness of breath.  1 Inhaler  0  . albuterol (PROVENTIL) (2.5 MG/3ML) 0.083% nebulizer solution Take 3 mLs (2.5 mg total) by nebulization every 6 (six) hours as needed for wheezing or shortness of breath.  75 mL  12  . carvedilol (COREG) 3.125 MG tablet Take 1 tablet (3.125 mg total) by mouth 2 (two) times daily.  180 tablet  3  . emollient (BIAFINE) cream Apply 1 application topically daily. Apply to affected treated area after rad  tx daily      . feeding supplement, ENSURE COMPLETE, (ENSURE COMPLETE) LIQD Take 237 mLs by mouth 3 (three) times daily between meals.  90 Bottle  0  . Multiple Vitamin (MULTIVITAMIN WITH MINERALS) TABS tablet Take 1 tablet by mouth daily.      . Oxycodone HCl 10 MG TABS Take 1 tablet (10 mg total) by mouth every 8 (eight) hours as needed.  90 tablet  0  . polyethylene glycol powder (MIRALAX) powder Use as directed  255 g  0  . tiotropium (SPIRIVA) 18 MCG inhalation capsule Place 1 capsule (18 mcg total) into inhaler and inhale daily.  30 capsule  0   No current facility-administered medications for this encounter.     ALLERGIES: Review of patient's allergies indicates no known allergies.   LABORATORY DATA:  Lab Results  Component Value Date   WBC 11.2* 01/19/2014   HGB 9.8* 01/19/2014   HCT 30.9* 01/19/2014   MCV 81.3 01/19/2014   PLT 161 01/19/2014   Lab Results  Component Value Date   NA 144 01/19/2014   K 3.6* 01/19/2014   CL 100 01/19/2014   CO2 32 01/19/2014   Lab Results  Component Value Date   ALT  12 02/11/2011   AST 20 02/11/2011   ALKPHOS 45 02/11/2011   BILITOT 0.50 02/11/2011     NARRATIVE: [name] was seen today for weekly treatment management. The chart was checked and the patient's films were reviewed. The patient states he is doing well with treatment. He really complains of no pain currently. He does have pain medicine if needed. The patient has continued to swallow without substantial difficulties.  PHYSICAL EXAMINATION: weight is 108 lb 8 oz (49.215 kg).      no significant mucositis present within the oral cavity. The patient's skin has held up very well without substantial change.  ASSESSMENT: The patient is doing satisfactorily with treatment.  PLAN: We will continue with the patient's radiation treatment as planned. The patient will be seen 1 month after completing his radiation treatment.

## 2014-03-10 ENCOUNTER — Ambulatory Visit: Payer: Medicare PPO

## 2014-03-10 ENCOUNTER — Ambulatory Visit
Admission: RE | Admit: 2014-03-10 | Discharge: 2014-03-10 | Disposition: A | Discharge status: Home / Self Care | Payer: Medicare PPO | Source: Ambulatory Visit | Attending: Radiation Oncology | Admitting: Radiation Oncology

## 2014-03-10 DIAGNOSIS — Z510 Encounter for antineoplastic radiation therapy: Secondary | ICD-10-CM | POA: Diagnosis not present

## 2014-03-11 ENCOUNTER — Encounter: Payer: Self-pay | Admitting: Radiation Oncology

## 2014-03-11 ENCOUNTER — Ambulatory Visit
Admission: RE | Admit: 2014-03-11 | Discharge: 2014-03-11 | Disposition: A | Discharge status: Home / Self Care | Payer: Medicare PPO | Source: Ambulatory Visit | Attending: Radiation Oncology | Admitting: Radiation Oncology

## 2014-03-11 ENCOUNTER — Ambulatory Visit: Payer: Medicare PPO

## 2014-03-11 DIAGNOSIS — Z510 Encounter for antineoplastic radiation therapy: Secondary | ICD-10-CM | POA: Diagnosis not present

## 2014-03-25 NOTE — Addendum Note (Signed)
Encounter addended by: Marye Round, MD on: 03/25/2014  9:32 PM<BR>     Documentation filed: Notes Section, Visit Diagnoses

## 2014-03-25 NOTE — Progress Notes (Signed)
  Radiation Oncology         [phone] ________________________________  Name: [name] MRN: [id number]  Date: 02/25/2014  DOB: Dec 05, 1929  Simulation Verification Note   NARRATIVE: The patient was brought to the treatment unit and placed in the planned treatment position. The clinical setup was verified. Then port films were obtained and uploaded to the radiation oncology medical record software.  The treatment beams were carefully compared against the planned radiation fields. The position, location, and shape of the radiation fields was reviewed. The targeted volume of tissue appears to be appropriately covered by the radiation beams. Based on my personal review, I approved the simulation verification. The patient's treatment will proceed as planned.  ________________________________   Jodelle Gross, MD, PhD

## 2014-03-25 NOTE — Progress Notes (Signed)
  Radiation Oncology         [phone]) [phone] ________________________________  Name: [name] MRN: [id number]  Date: 03/11/2014  DOB: [birth year]  End of Treatment Note  Diagnosis:   Squamous cell carcinoma of the oropharynx     Indication for treatment:  Palliative       Radiation treatment dates:   02/28/2014 through 03/11/2014  Site/dose:   Left neck/oral cavity/oropharynx  Narrative: The patient tolerated radiation treatment relatively well.   The patient did not have substantial difficulty during treatment. He did well in terms of mucositis/oral irritation.  Plan: The patient has completed radiation treatment. The patient will return to radiation oncology clinic for routine followup in one month. I advised the patient to call or return sooner if they have any questions or concerns related to their recovery or treatment. ________________________________  Jodelle Gross, M.D., Ph.D.

## 2014-03-31 ENCOUNTER — Telehealth: Payer: Self-pay | Admitting: *Deleted

## 2014-03-31 NOTE — Telephone Encounter (Signed)
Called [facility]osp[facility] [phone] after confirming new Attending Hospice Provider for this patient.  Message left on voicemail for Lexa that Dr. Jamesetta So will be the attending provider.

## 2014-03-31 NOTE — Telephone Encounter (Signed)
Lexa RN with Hospice called reporting a notable decline in this patient from last week.  He was independent and is now bed bound.  A daughter from up [name] is coming to care for him.  Asked that [facility] call Hospice main number to notify [facility]osp[facility] of new provider for this patient.  [phone]

## 2014-04-11 ENCOUNTER — Encounter: Payer: Self-pay | Admitting: Radiation Oncology

## 2014-04-14 ENCOUNTER — Telehealth: Payer: Self-pay | Admitting: *Deleted

## 2014-04-14 ENCOUNTER — Ambulatory Visit: Admission: RE | Admit: 2014-04-14 | Payer: Medicare PPO | Source: Ambulatory Visit | Admitting: Radiation Oncology

## 2014-04-14 HISTORY — DX: Personal history of irradiation: Z92.3

## 2014-04-14 NOTE — Telephone Encounter (Signed)
Received call from [facility], Hospice nurse, that family wants him to go to [facility] for the End of Life care. Her number is [phone].

## 2014-04-21 DEATH — deceased

## 2014-04-25 ENCOUNTER — Encounter: Payer: Self-pay | Admitting: *Deleted

## 2014-04-25 NOTE — Progress Notes (Signed)
Received fax from [facility] that Geral died on 05/09/2014 at 12:55 pm. Forwarded to Dr. Renella Cunas desk.

## 2014-04-27 ENCOUNTER — Other Ambulatory Visit: Payer: Self-pay | Admitting: Hematology and Oncology

## 2014-04-27 ENCOUNTER — Other Ambulatory Visit: Payer: Self-pay | Admitting: Hematology

## 2014-05-02 ENCOUNTER — Telehealth: Payer: Self-pay | Admitting: *Deleted

## 2014-05-02 NOTE — Telephone Encounter (Signed)
On 05-02-14 mail medical records to pt daughter.

## 2014-08-04 ENCOUNTER — Encounter (HOSPITAL_COMMUNITY): Payer: Self-pay | Admitting: Otolaryngology

## 2014-09-15 ENCOUNTER — Telehealth: Payer: Self-pay | Admitting: Hematology and Oncology

## 2014-09-15 NOTE — Telephone Encounter (Signed)
Mailed pt's medical records to [employer] insurance claim. ROI form completed

## 2016-06-15 IMAGING — CT CT ANGIO CHEST
2 of 8 series · 18 of 46 positions shown · IV contrast (Omni 300)
Comparison: 01/07/2011.

CLINICAL DATA: Hypoxia.

EXAM:
CT ANGIOGRAPHY CHEST WITH CONTRAST
TECHNIQUE: Multidetector CT imaging of the chest was performed using the
standard protocol during bolus administration of intravenous
contrast. Multiplanar CT image reconstructions and MIPs were
obtained to evaluate the vascular anatomy.
CONTRAST:  88mL OMNIPAQUE IOHEXOL 350 MG/ML SOLN

[Series 5: thins · axial · 0.62mm/px · z∈[-231,+55]mm · 15 of 316 slices shown]
[im 15/316  lung]
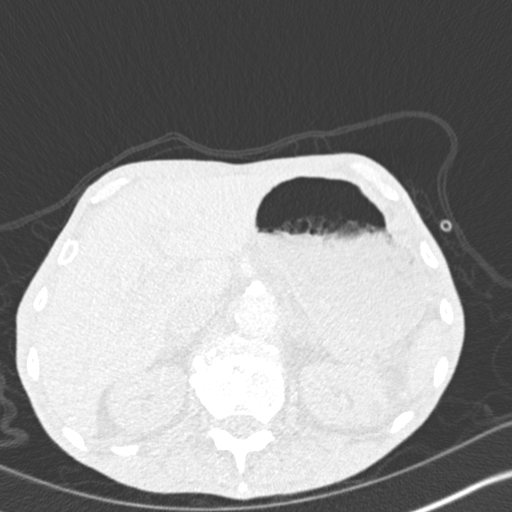
[im 43/316  soft-tissue]
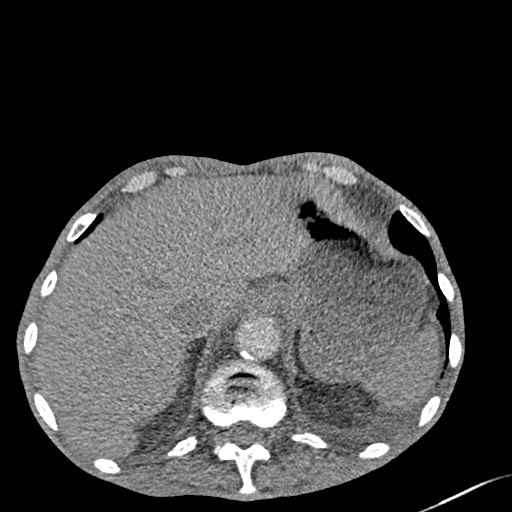
[im 58/316  lung]
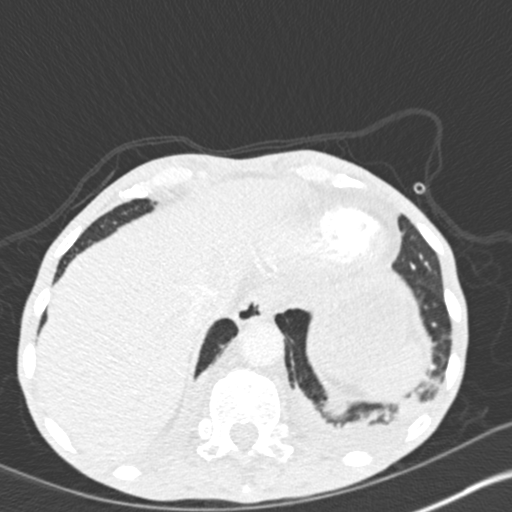
[im 72/316  soft-tissue]
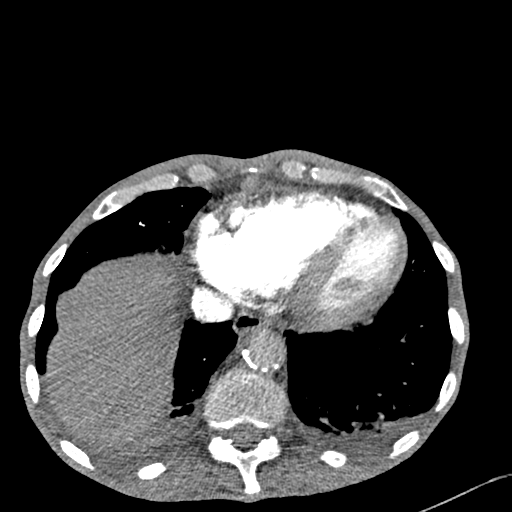
[im 101/316  lung]
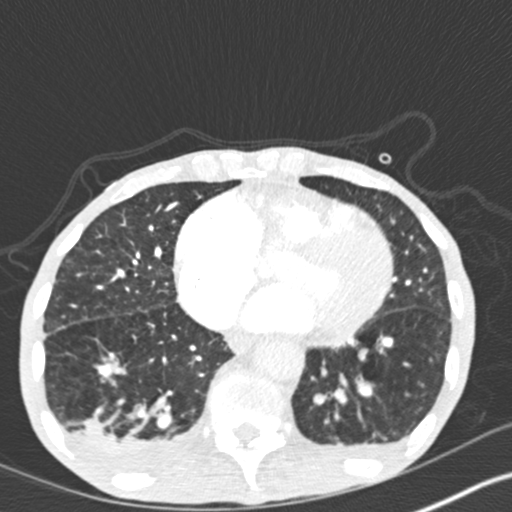
[im 115/316  soft-tissue]
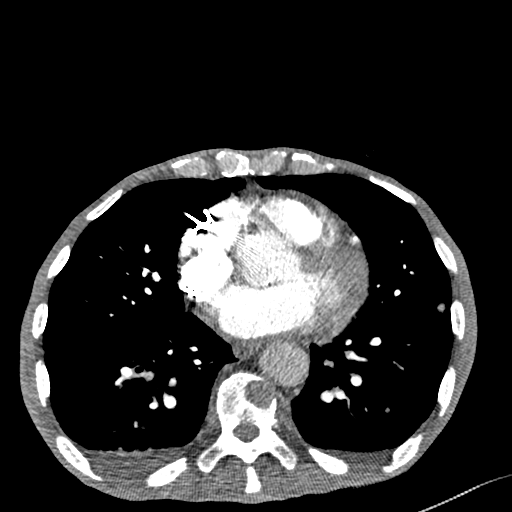
[im 144/316  lung]
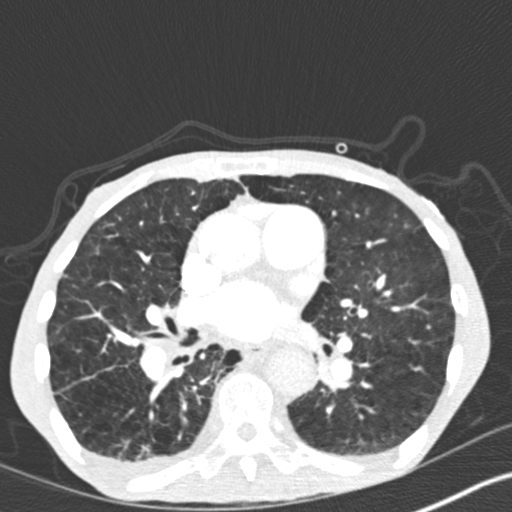
[im 158/316  soft-tissue]
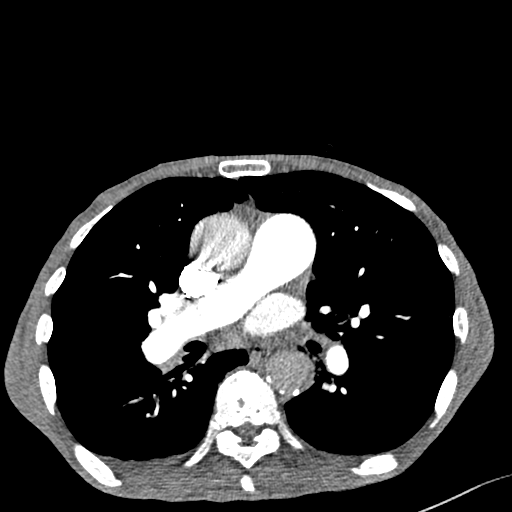
[im 172/316  lung]
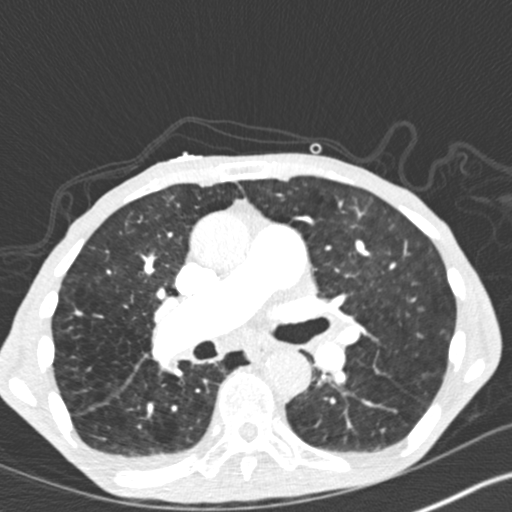
[im 201/316  soft-tissue]
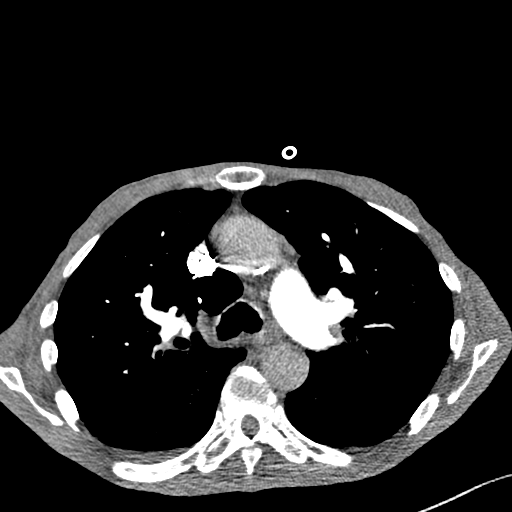
[im 215/316  lung]
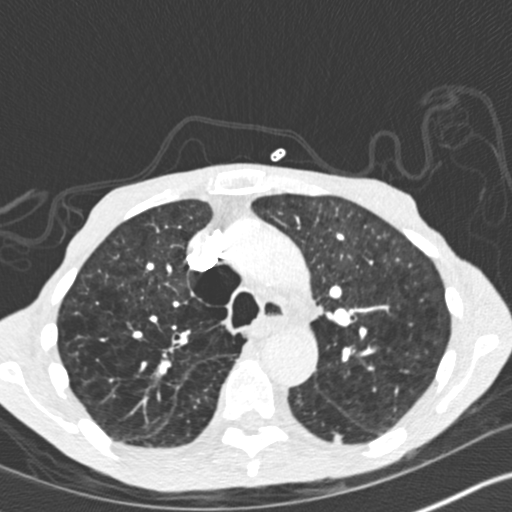
[im 244/316  soft-tissue]
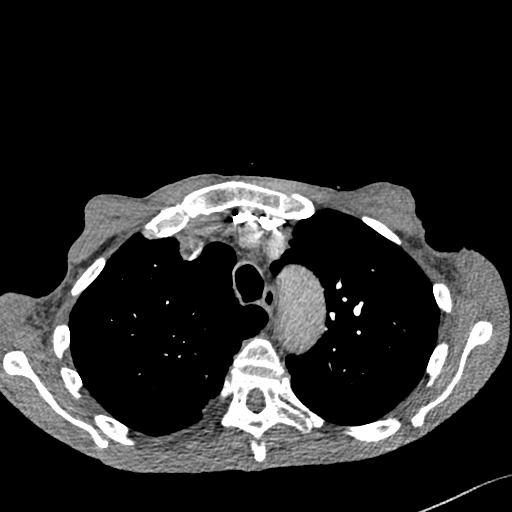
[im 258/316  lung]
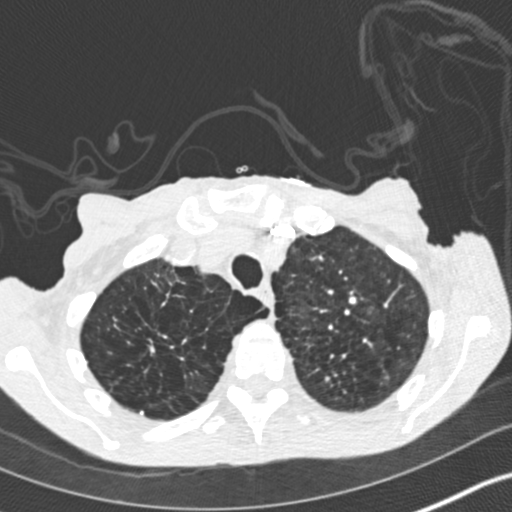
[im 273/316  soft-tissue]
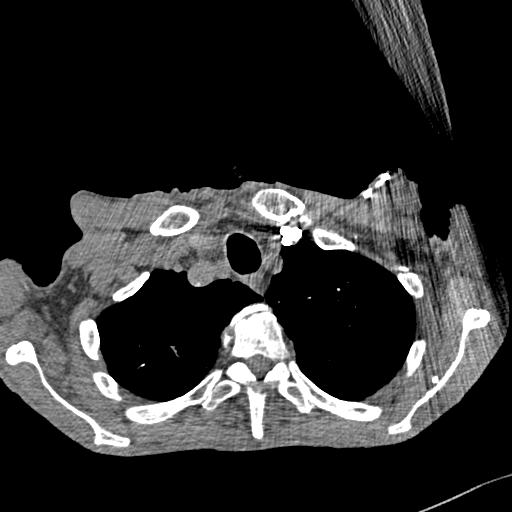
[im 301/316  lung]
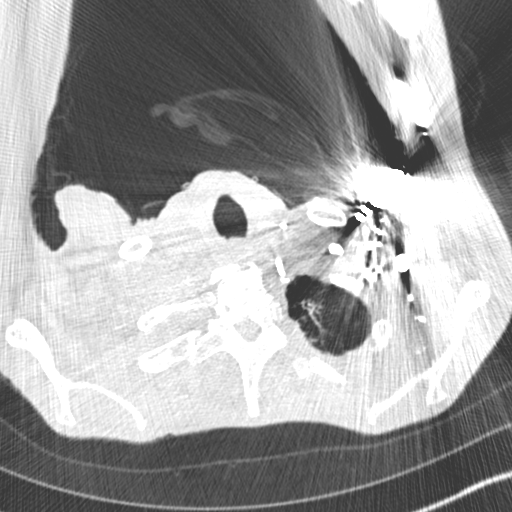

[Series 7: coronal mpr · coronal · 0.62mm/px · 3 of 107 slices shown]
[im 27/107  soft-tissue]
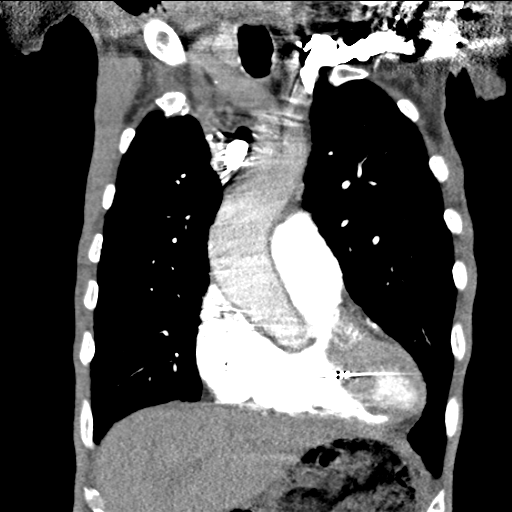
[im 54/107  soft-tissue]
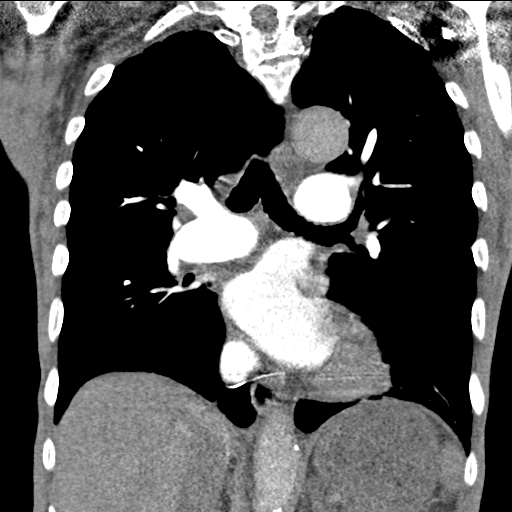
[im 80/107  soft-tissue]
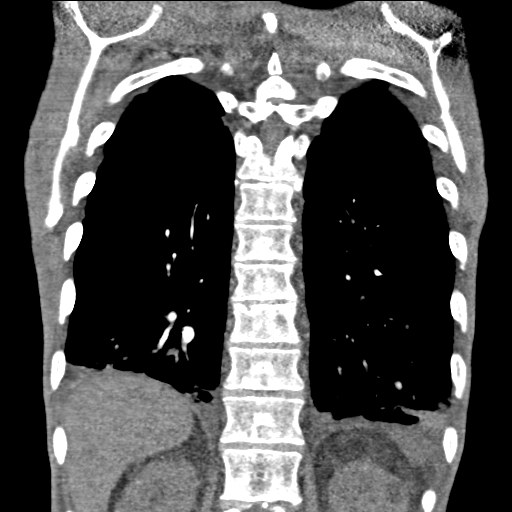

[18 of 46 positions shown; findings below may reference images not displayed]

FINDINGS: No acute pulmonary emboli. No pathologically enlarged mediastinal
lymph nodes. Bi hilar lymphoid tissue is unchanged. Pulmonary
arteries are enlarged, as is the heart. No pericardial effusion.

Small bilateral pleural effusions. Moderately severe centrilobular
emphysema with bullous changes at the apices. Biapical pleural
parenchymal scarring. An 8 x 12 mm nodular density seen in the
apical right upper lobe (image 17, series 6), new from 01/07/2011.
Dependent volume loss in the lower lobes bilaterally. Subpleural
scarring in the lingula (image 53). Spiculated 9 mm nodule in the
lingula (image 60), new. Slightly more inferiorly within the lingula
is a 7 mm nodule (image 67), also new. A 7 mm nodule in the superior
segment left lower lobe (image 34) is new. There may be an nodule in
the right lower lobe as well, adjacent to atelectasis (series 6,
image 71). Scattered calcified granulomas. Airway is unremarkable.

Incidental imaging of the upper abdomen shows the visualized
portions of the liver, adrenal glands and right kidney to be grossly
unremarkable. A 12 mm low-attenuation lesion in the left kidney is
slightly smaller than on 01/07/2011 and likely a cyst. Visualized
portions of the spleen and stomach are grossly unremarkable.

There is a new lucent lesion in the T10 vertebral body, measuring
1.7 x 2.0 cm.

Review of the MIP images confirms the above findings.
IMPRESSION: 1. Negative for acute pulmonary embolus.
2. Lingular and left lower lobe nodules are new. Possible right
lower lobe nodule. Primary bronchogenic carcinoma and metastatic
disease can both have this appearance.
3. New lytic lesion in the T10 vertebral body, most consistent with
metastatic disease.
4. The above findings will be called to the ordering clinician or
representative by the Radiologist Assistant, and communication
documented in the PACS or zVision Dashboard.
5. 8 x 12 mm nodular density in the apical right upper lobe, new
from 01/07/2011. Continued attention on followup exams is warranted.
6. Enlarged pulmonary arteries, indicative of pulmonary arterial
hypertension.
7. Small bilateral effusions with mild volume loss in the lower
lobes.
